# Patient Record
Sex: Female | Born: 1980 | Race: Black or African American | Hispanic: No | Marital: Single | Smoking: Current every day smoker
Health system: Southern US, Community
[De-identification: ages and names within clinical notes are randomized; demographics above are authoritative.]

## PROBLEM LIST (undated history)

## (undated) DIAGNOSIS — F319 Bipolar disorder, unspecified: Secondary | ICD-10-CM

## (undated) DIAGNOSIS — J45909 Unspecified asthma, uncomplicated: Secondary | ICD-10-CM

## (undated) DIAGNOSIS — I10 Essential (primary) hypertension: Secondary | ICD-10-CM

## (undated) DIAGNOSIS — J40 Bronchitis, not specified as acute or chronic: Secondary | ICD-10-CM

---

## 2006-10-30 ENCOUNTER — Emergency Department: Payer: Self-pay | Admitting: Emergency Medicine

## 2010-02-16 ENCOUNTER — Emergency Department: Payer: Self-pay | Admitting: Internal Medicine

## 2010-08-10 ENCOUNTER — Emergency Department: Payer: Self-pay | Admitting: Unknown Physician Specialty

## 2011-03-26 ENCOUNTER — Emergency Department: Payer: Self-pay | Admitting: Unknown Physician Specialty

## 2011-03-26 LAB — URINALYSIS, COMPLETE
Bacteria: NONE SEEN
Bilirubin,UR: NEGATIVE
Blood: NEGATIVE
Glucose,UR: NEGATIVE mg/dL (ref 0–75)
Ketone: NEGATIVE
Leukocyte Esterase: NEGATIVE
Nitrite: NEGATIVE
Ph: 6 (ref 4.5–8.0)
Protein: NEGATIVE
RBC,UR: 1 /HPF (ref 0–5)
Specific Gravity: 1.013 (ref 1.003–1.030)
Squamous Epithelial: 6
WBC UR: 4 /HPF (ref 0–5)

## 2011-03-26 LAB — PREGNANCY, URINE: Pregnancy Test, Urine: NEGATIVE m[IU]/mL

## 2011-10-26 ENCOUNTER — Emergency Department: Payer: Self-pay | Admitting: Emergency Medicine

## 2011-10-26 LAB — BASIC METABOLIC PANEL
Anion Gap: 8 (ref 7–16)
BUN: 6 mg/dL — ABNORMAL LOW (ref 7–18)
Calcium, Total: 8.9 mg/dL (ref 8.5–10.1)
Co2: 26 mmol/L (ref 21–32)
EGFR (African American): >60
Glucose: 100 mg/dL — ABNORMAL HIGH (ref 65–99)
Osmolality: 279 (ref 275–301)
Potassium: 3.8 mmol/L (ref 3.5–5.1)

## 2011-10-26 LAB — CBC
HCT: 38.9 % (ref 35.0–47.0)
HGB: 13.5 g/dL (ref 12.0–16.0)
MCH: 30.8 pg (ref 26.0–34.0)
MCHC: 34.7 g/dL (ref 32.0–36.0)
Platelet: 232 10*3/uL (ref 150–440)
RBC: 4.39 10*6/uL (ref 3.80–5.20)
WBC: 4 10*3/uL (ref 3.6–11.0)

## 2011-10-26 LAB — CK TOTAL AND CKMB (NOT AT ARMC)
CK, Total: 147 U/L (ref 21–215)
CK-MB: 0.6 ng/mL (ref 0.5–3.6)

## 2011-10-26 LAB — TROPONIN I: Troponin-I: <0.02 ng/mL

## 2012-04-20 ENCOUNTER — Emergency Department: Payer: Self-pay | Admitting: Emergency Medicine

## 2013-10-15 ENCOUNTER — Emergency Department: Payer: Self-pay | Admitting: Emergency Medicine

## 2014-03-11 ENCOUNTER — Emergency Department: Payer: Self-pay | Admitting: Emergency Medicine

## 2015-01-06 ENCOUNTER — Other Ambulatory Visit: Payer: Self-pay | Admitting: Obstetrics and Gynecology

## 2015-01-06 DIAGNOSIS — Z3491 Encounter for supervision of normal pregnancy, unspecified, first trimester: Secondary | ICD-10-CM

## 2015-01-07 ENCOUNTER — Encounter: Payer: Self-pay | Admitting: Emergency Medicine

## 2015-01-07 ENCOUNTER — Observation Stay: Payer: Medicaid Other

## 2015-01-07 ENCOUNTER — Observation Stay
Admission: EM | Admit: 2015-01-07 | Discharge: 2015-01-08 | Disposition: A | Discharge status: Other Acute Inpatient Hospital | Payer: Medicaid Other | Attending: Obstetrics and Gynecology | Admitting: Obstetrics and Gynecology

## 2015-01-07 ENCOUNTER — Emergency Department: Payer: Medicaid Other

## 2015-01-07 ENCOUNTER — Ambulatory Visit (HOSPITAL_COMMUNITY)
Admission: AD | Admit: 2015-01-07 | Discharge: 2015-01-07 | Disposition: A | Discharge status: Home / Self Care | Payer: Medicaid Other | Source: Other Acute Inpatient Hospital | Attending: Obstetrics and Gynecology | Admitting: Obstetrics and Gynecology

## 2015-01-07 DIAGNOSIS — O2620 Pregnancy care for patient with recurrent pregnancy loss, unspecified trimester: Secondary | ICD-10-CM

## 2015-01-07 DIAGNOSIS — N939 Abnormal uterine and vaginal bleeding, unspecified: Principal | ICD-10-CM | POA: Insufficient documentation

## 2015-01-07 DIAGNOSIS — Z3A20 20 weeks gestation of pregnancy: Secondary | ICD-10-CM | POA: Diagnosis present

## 2015-01-07 DIAGNOSIS — I10 Essential (primary) hypertension: Secondary | ICD-10-CM | POA: Diagnosis not present

## 2015-01-07 DIAGNOSIS — O4102X0 Oligohydramnios, second trimester, not applicable or unspecified: Secondary | ICD-10-CM

## 2015-01-07 DIAGNOSIS — Z3A00 Weeks of gestation of pregnancy not specified: Secondary | ICD-10-CM | POA: Diagnosis not present

## 2015-01-07 DIAGNOSIS — O4103X0 Oligohydramnios, third trimester, not applicable or unspecified: Secondary | ICD-10-CM | POA: Insufficient documentation

## 2015-01-07 DIAGNOSIS — F319 Bipolar disorder, unspecified: Secondary | ICD-10-CM | POA: Insufficient documentation

## 2015-01-07 DIAGNOSIS — O99332 Smoking (tobacco) complicating pregnancy, second trimester: Secondary | ICD-10-CM | POA: Diagnosis not present

## 2015-01-07 DIAGNOSIS — O99212 Obesity complicating pregnancy, second trimester: Secondary | ICD-10-CM | POA: Diagnosis not present

## 2015-01-07 DIAGNOSIS — O2690 Pregnancy related conditions, unspecified, unspecified trimester: Secondary | ICD-10-CM | POA: Diagnosis present

## 2015-01-07 DIAGNOSIS — Z6841 Body Mass Index (BMI) 40.0 and over, adult: Secondary | ICD-10-CM | POA: Diagnosis not present

## 2015-01-07 DIAGNOSIS — O4400 Placenta previa specified as without hemorrhage, unspecified trimester: Secondary | ICD-10-CM | POA: Insufficient documentation

## 2015-01-07 DIAGNOSIS — O209 Hemorrhage in early pregnancy, unspecified: Secondary | ICD-10-CM | POA: Diagnosis present

## 2015-01-07 DIAGNOSIS — O4402 Placenta previa specified as without hemorrhage, second trimester: Secondary | ICD-10-CM

## 2015-01-07 DIAGNOSIS — R52 Pain, unspecified: Secondary | ICD-10-CM

## 2015-01-07 HISTORY — DX: Bipolar disorder, unspecified: F31.9

## 2015-01-07 HISTORY — DX: Unspecified asthma, uncomplicated: J45.909

## 2015-01-07 HISTORY — DX: Essential (primary) hypertension: I10

## 2015-01-07 LAB — CBC WITH DIFFERENTIAL/PLATELET
BASOS ABS: 0.1 10*3/uL (ref 0–0.1)
BASOS PCT: 1 %
EOS ABS: 0.2 10*3/uL (ref 0–0.7)
EOS PCT: 2 %
HCT: 34.4 % — ABNORMAL LOW (ref 35.0–47.0)
Hemoglobin: 11.3 g/dL — ABNORMAL LOW (ref 12.0–16.0)
LYMPHS PCT: 16 %
Lymphs Abs: 1.7 10*3/uL (ref 1.0–3.6)
MCH: 28.9 pg (ref 26.0–34.0)
MCHC: 32.9 g/dL (ref 32.0–36.0)
MCV: 87.9 fL (ref 80.0–100.0)
MONO ABS: 0.7 10*3/uL (ref 0.2–0.9)
Monocytes Relative: 7 %
Neutro Abs: 8.3 10*3/uL — ABNORMAL HIGH (ref 1.4–6.5)
Neutrophils Relative %: 74 %
PLATELETS: 288 10*3/uL (ref 150–440)
RBC: 3.91 MIL/uL (ref 3.80–5.20)
RDW: 14.6 % — AB (ref 11.5–14.5)
WBC: 11 10*3/uL (ref 3.6–11.0)

## 2015-01-07 LAB — URINALYSIS COMPLETE WITH MICROSCOPIC (ARMC ONLY)
BACTERIA UA: NONE SEEN
Bilirubin Urine: NEGATIVE
Glucose, UA: NEGATIVE mg/dL
Ketones, ur: NEGATIVE mg/dL
NITRITE: NEGATIVE
PH: 8 (ref 5.0–8.0)
PROTEIN: 100 mg/dL — AB
Specific Gravity, Urine: 1.013 (ref 1.005–1.030)

## 2015-01-07 LAB — COMPREHENSIVE METABOLIC PANEL
ALT: 14 U/L (ref 14–54)
AST: 16 U/L (ref 15–41)
Albumin: 2.9 g/dL — ABNORMAL LOW (ref 3.5–5.0)
Alkaline Phosphatase: 39 U/L (ref 38–126)
Anion gap: 6 (ref 5–15)
BUN: 7 mg/dL (ref 6–20)
CHLORIDE: 104 mmol/L (ref 101–111)
CO2: 26 mmol/L (ref 22–32)
Calcium: 9 mg/dL (ref 8.9–10.3)
Creatinine, Ser: 0.52 mg/dL (ref 0.44–1.00)
GFR calc Af Amer: >60 mL/min (ref >60)
Glucose, Bld: 104 mg/dL — ABNORMAL HIGH (ref 65–99)
POTASSIUM: 3.9 mmol/L (ref 3.5–5.1)
SODIUM: 136 mmol/L (ref 135–145)
Total Bilirubin: 0.1 mg/dL — ABNORMAL LOW (ref 0.3–1.2)
Total Protein: 6.3 g/dL — ABNORMAL LOW (ref 6.5–8.1)

## 2015-01-07 LAB — HCG, QUANTITATIVE, PREGNANCY: HCG, BETA CHAIN, QUANT, S: 16522 m[IU]/mL — AB (ref <5)

## 2015-01-07 LAB — PREGNANCY, URINE: PREG TEST UR: POSITIVE — AB

## 2015-01-07 MED ORDER — ACETAMINOPHEN 500 MG PO TABS
1000.0000 mg | ORAL_TABLET | Freq: Once | ORAL | Status: AC
  Administered 2015-01-07: 1000 mg via ORAL
  Filled 2015-01-07: qty 2

## 2015-01-07 MED ORDER — SODIUM CHLORIDE 0.9 % IJ SOLN: 3.0000 mL | Freq: Two times a day (BID) | INTRAMUSCULAR | Status: DC

## 2015-01-07 MED ORDER — DEXTROSE IN LACTATED RINGERS 5 % IV SOLN
INTRAVENOUS | Status: DC
  Administered 2015-01-07: 21:00:00 via INTRAVENOUS

## 2015-01-07 NOTE — ED Notes (Signed)
Patient transported to Ultrasound 

## 2015-01-07 NOTE — ED Notes (Signed)
Pt actively passing blood clots vaginally. MD aware.

## 2015-01-07 NOTE — ED Provider Notes (Signed)
[facility] Emergency Department Provider Note     Time seen: ----------------------------------------- 11:06 AM on 01/07/2015 -----------------------------------------    I have reviewed the triage vital signs and the nursing notes.   HISTORY  Chief Complaint No chief complaint on file.    HPI Andrea Hardy is a 34 y.o. female who presents ER stating she is 3 months pregnant, woke up and started having abdominal cramping and vaginal bleeding. Earlier he was mild bleeding and now she is having heavier bleeding. Patient reports just starting prenatal visits last week, this first pregnancy.   No past medical history on file.  There are no active problems to display for this patient.   No past surgical history on file.  Allergies Review of patient's allergies indicates not on file.  Social History Social History  Substance Use Topics  . Smoking status: Not on file  . Smokeless tobacco: Not on file  . Alcohol Use: Not on file    Review of Systems Constitutional: Negative for fever. Eyes: Negative for visual changes. ENT: Negative for sore throat. Cardiovascular: Negative for chest pain. Respiratory: Negative for shortness of breath. Gastrointestinal: Positive for lower abdominal cramping Genitourinary: Negative for dysuria. Positive for vaginal bleeding Musculoskeletal: Negative for back pain. Skin: Negative for rash. Neurological: Negative for headaches, focal weakness or numbness.  10-point ROS otherwise negative.  ____________________________________________   PHYSICAL EXAM:  VITAL SIGNS: ED Triage Vitals  Enc Vitals Group     BP --      Pulse --      Resp --      Temp --      Temp src --      SpO2 --      Weight --      Height --      Head Cir --      Peak Flow --      Pain Score --      Pain Loc --      Pain Edu? --      Excl. in GC? --     Constitutional: Alert and oriented. Well appearing and in no  distress. Eyes: Conjunctivae are normal. PERRL. Normal extraocular movements. ENT   Head: Normocephalic and atraumatic.   Nose: No congestion/rhinnorhea.   Mouth/Throat: Mucous membranes are moist.   Neck: No stridor. Cardiovascular: Normal rate, regular rhythm. Normal and symmetric distal pulses are present in all extremities. No murmurs, rubs, or gallops. Respiratory: Normal respiratory effort without tachypnea nor retractions. Breath sounds are clear and equal bilaterally. No wheezes/rales/rhonchi. Gastrointestinal: Soft and nontender. No distention. No abdominal bruits.  Genitourinary: Patient passing clots with active vaginal bleeding Musculoskeletal: Nontender with normal range of motion in all extremities. No joint effusions.  No lower extremity tenderness nor edema. Neurologic:  Normal speech and language. No gross focal neurologic deficits are appreciated. Speech is normal. No gait instability. Skin:  Skin is warm, dry and intact. No rash noted. Psychiatric: Mood and affect are normal. Speech and behavior are normal. Patient exhibits appropriate insight and judgment. ____________________________________________  ED COURSE:  Pertinent labs & imaging results that were available during my care of the patient were reviewed by me and considered in my medical decision making (see chart for details). Patient is in no acute distress, will check labs and ultrasound. ____________________________________________    LABS (pertinent positives/negatives)  Labs Reviewed  CBC WITH DIFFERENTIAL/PLATELET - Abnormal; Notable for the following:    Hemoglobin 11.3 (*)    HCT 34.4 (*)  RDW 14.6 (*)    Neutro Abs 8.3 (*)    All other components within normal limits  COMPREHENSIVE METABOLIC PANEL - Abnormal; Notable for the following:    Glucose, Bld 104 (*)    Total Protein 6.3 (*)    Albumin 2.9 (*)    Total Bilirubin 0.1 (*)    All other components within normal limits   URINALYSIS COMPLETEWITH MICROSCOPIC ([facility] ONLY) - Abnormal; Notable for the following:    Color, Urine RED (*)    APPearance HAZY (*)    Hgb urine dipstick 3+ (*)    Protein, ur 100 (*)    Leukocytes, UA TRACE (*)    Squamous Epithelial / LPF 0-5 (*)    All other components within normal limits  PREGNANCY, URINE - Abnormal; Notable for the following:    Preg Test, Ur POSITIVE (*)    All other components within normal limits  HCG, QUANTITATIVE, PREGNANCY - Abnormal; Notable for the following:    hCG, Beta Chain, Quant, S [id number] (*)    All other components within normal limits    RADIOLOGY  Pregnancy ultrasound  IMPRESSION: Single live intrauterine gestation of 20 weeks 1 day.  Complete placenta previa is noted as well.  However, oligohydramnios is noted, and no fetal movement was observed. Critical Value/emergent results were called by telephone at the time of interpretation on 01/07/2015 at 2:08 pm to Dr. Daryel November , who verbally acknowledged these results.  This exam is performed on an emergent basis and does not comprehensively evaluate fetal size, dating, or anatomy; follow-up complete OB US should be considered if further fetal assessment is warranted. ____________________________________________  FINAL ASSESSMENT AND PLAN  Placenta previa, second trimester pregnancy with bleeding, oligohydramnios, poor fetal movement.  Plan: Patient with labs and imaging as dictated above. Patient with above ultrasound findings, will discuss with OB/GYN on-call for [facility].  Be on 20 weeks with bleeding and pain. She will be sent up to the labor and delivery unit for further evaluation. I have attempted to contact [facility] but have not heard back.  Emily Filbert, MD   Emily Filbert, MD 01/07/15 [phone]

## 2015-01-07 NOTE — ED Notes (Signed)
Pt in via EMS; states she is 3 months pregnant w/ first pregnancy, woke up this morning w/ abdominal cramping and found blood in underwear when going to bathroom.  States she passed a clot about the size of a 50 cent piece and has continued to bleed.  Pt reports just starting prenatal visits last week, denies any previous issues w/ pregnancy.  Pt vital signs WDL, no immediate distress at this time.

## 2015-01-07 NOTE — H&P (Signed)
HPI: The patient is here for vag bleeding since 10 am. Had 1 50 cent clot in ER and has had 50% of 2 peripads with reddish blood since being here in birthpalce. Initially, there was a FHR but, now RN unable to find one. Doppler used but, no FHR. [place] placed and fetal activity was absent. [place] ordered stat.  She is a 34 y.o., female, G4P0030, at [redacted]w[redacted]d based on LMP of 10/25/14, with an Estimated Date of Delivery: 08/01/15. She is a transfer from [facility]. She reports the FOB is not involved and his here with her friend Florentina Addison today. She reports she had a miscarriage in 2014, and had 2 TABs in 2015 and in January 2016, but has decided she wants to keep this baby. She reports she has a history of hypertension and was taken off her medication when she found out she was pregnant. She also has a history of bipolar disorder and was taken off Latuda until cleared by [place] and states she is struggling without the medication. I recommend that she resume Latuda as it is a Category B and to inform us if she has worsening s/s or suicidal/homicidal ideation - benefits outweighs the risk. She also reports that she had an ultrasound of her right neck for "two lumps," but states she has not had a f/u due to pregnancy. She denies complications during this pregnancy thus far.   OB History  Gravida Para Term Preterm AB SAB TAB Ectopic Multiple Living  # Outcome Date GA Lbr Len/2nd Weight Sex Delivery Anes PTL Lv  4 Current  3 TAB 03/05/14  2 TAB 04/12/13  1 SAB 09/05/12    The following portions of the patient's history were reviewed and updated as appropriate: Past Medical History: has a past medical history of Bipolar disease, chronic and Hypertension. Past Surgical History: has no past surgical history on file. Family History: family history includes Diabetes mellitus in her mother; Hypertension in her mother. She was adopted. Social History: reports that she has been smoking. She has been smoking  about 0.50 packs per day. She does not have any smokeless tobacco history on file. She reports that she does not drink alcohol or use illicit drugs. Current Meds: has a current medication list which includes the following prescription(s): lurasidone.  Allergies: has No Known Allergies.  Patient Active Problem List  Diagnosis  . Supervision of high risk pregnancy in first trimester  . Chronic hypertension affecting pregnancy  . Morbid obesity with BMI of 45.0-49.9, adult  . Bipolar disorder, current episode mixed, moderate  . Bacterial vaginosis   GENETIC HX: NO Down syndrome, No Trisomies or spinal bifida on either side of family. No twin or triplet history.  ROS: General: No weight loss or weight gain, fatigue or fevers. HEENT: No change in vision, double vision or loss of hearing. No nosebleeds, difficulty swallowing, sore throat or any other complaints.  BREAST: + breast tenderness, no nipple discharge, no masses, no skin changes, no nodes HEART: No CP, palpatations, swelling in the feet or legs or difficulty breathing while lying flat. LUNGS:+ SOB on exertion, no Cough, Congestion or wheezing. GASTROINTESTINAL: No nausea, vomiting, diarrhea, constipation, heartburn or stomach pain GENITOURINARY: No urgency, + frequency, no dysuria. MUSCULOSKELETAL: No joint pain, weakness, cramping or back pain. NEUROLOGIC: No frequent HA's, + bilateral hand numbness, no blackouts or dizziness. PSYCHIATRIC: No anxiety, panic or depression or increased stress or suicidal ideation. +bipolar ENDOCRINE: No heat or  cold intolerance, excessive thirst or hunger. HEMATOLOGIC/LYMPH: No easy bruising, + right neck swollen lymph nodes. GYN: + vaginal bleeding, cramping, +abnormal vaginal discharge, no  vaginal odor.  EXAM: VITALS:  Vitals:  01/07/15   BP: 125/59 Pulse: 87 Reso 20  Body mass index is 49.75 kg/(m^2). GENERAL: 34 y.o.yo female in NAD. A & O x 3. HEENT: Normocephalic. No thryomegaly, no  nodes. Eyes non-icteric. Teeth: dentition is good, no missing teeth or periodontal disease noted.   HEART:S1S2, No M/R/G. LUNGS:CTA bilat. No W/R/R. ABD: No organomegaly, No umbilical hernia. Fundal height is at 19 cms. No FHT found at 2000 by US. Ext Genitalia: Clear, no BUS. Right groin - 2 large/flesh colored skin tags - pt. Reports they have been there for a long time and have not changed / non-tender Labia: no condyloma present nor erythema. Vagina: +rugae, Bright red bloody seen in vault Cervix: Blood at the os, gentle spec exam with cx seen visibly at 1 cm. Uterus: 19 cms  FHT heard at @ 160 bpm Extremities: no edema, varicose veins or rashes present. Skin: Warm and dry to touch. No rashes or lesions present.  Assessment/Plan:  A: IUP at 20 weeks 2. Complete placenta previa 3. Extreme Obesity 4. Late to Prenatal care 2 days ago 5. Bi-polar disorder (taken off Latuda) 2 days ago 6.CHTN (taken off meds during the pregnancy) 7. Fetal heart rate: not detected this pm but, present earlier today 8. Oligohydramnios 9. Flat affect  P: [facility] to confirm cardiac absence of fetus prior to transport Gentle spec exam done and cx is visible 1 cm with bloody at the os 3. Pt at risk for Vag bleeding. 4. Pt and her friend were advised of the fetus cardiac activity being absent and advised of the need [facility] vs. IOL. Dr Arlie Sol[facility]onsPost [facility] and [facility]. IV started in case of bleeding. US pending.

## 2015-01-07 NOTE — OB Triage Note (Signed)
Patient sent from ER for vaginal bleeding, patient states she started bleeding heavly around 1000 bright red blood

## 2015-01-07 NOTE — Progress Notes (Signed)
Bleeding unchanged. Wating on US to verify fetal demise. [facility] for poss IOL vs D&E. [facility] has been contacted. As soon as US is complete, will access transport. Dr Aurelio BrashBeaseley is aware and agrees with plan of care. Pt informed of baby and the problem encountered today on US: Oligo and no fetal movement. Now, no FHR detected or found. Pt aware of the risks of heavy vag bleeding and poss need for transfusions due to potential blood loss form the complete previa. Pt agrees to transport.

## 2015-01-07 NOTE — Progress Notes (Signed)
US done and confirmed fetal demise at 20 weeks, no cardiac activity. Dr Jose PersiaAnna Lisa Post accepts [facility] and [facility] has unit enroute to retrieive pt.

## 2015-01-08 DIAGNOSIS — N939 Abnormal uterine and vaginal bleeding, unspecified: Secondary | ICD-10-CM | POA: Diagnosis not present

## 2015-01-20 ENCOUNTER — Ambulatory Visit: Payer: Self-pay

## 2015-02-03 ENCOUNTER — Ambulatory Visit: Payer: Medicaid Other

## 2015-02-28 ENCOUNTER — Ambulatory Visit
Admission: RE | Admit: 2015-02-28 | Discharge: 2015-02-28 | Disposition: A | Discharge status: Home / Self Care | Payer: Medicaid Other | Source: Ambulatory Visit | Attending: Obstetrics & Gynecology | Admitting: Obstetrics & Gynecology

## 2015-02-28 ENCOUNTER — Institutional Professional Consult (permissible substitution): Payer: Medicaid Other

## 2015-02-28 VITALS — BP 146/90 | HR 88 | Temp 98.2°F | Wt 262.0 lb

## 2015-02-28 DIAGNOSIS — Z8279 Family history of other congenital malformations, deformations and chromosomal abnormalities: Secondary | ICD-10-CM

## 2015-02-28 NOTE — Progress Notes (Addendum)
Referring physician:  [facility] Length of Consultation:  50 minutes  Andrea Hardy was seen for genetic counseling to review the results of genetic testing on her recent pregnancy loss.  This letter is a summary of our conversation and will be appended upon results of testing on the patient and the results of the autopsy on the fetus.  Review of records and discussion with the patient about her recent pregnancy loss revealed that she had some bleeding and her water broke on 01/08/2015 at [redacted] weeks gestation.  An ultrasound performed at the [facility] ER revealed no heart beat at that time and no anhydramnios. Visualization of fetal anatomy was limited by the lack of fluid.  The patient was sent to 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 Genetic testing through chromosome analysis and chromosomal microarray was performed on POC and an autopsy was also ordered.  At the time of this visit, the autopsy was still pending but is expected to be resulted out soon.  Chromosome analysis revealed normal karyotype, 46,XY.  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  This deletion is known to have highly variable findings, from a completely asymptomatic phenotype to findings which may include microcephaly, mild intellectual disability, dysmorphic facial features, eye anomalies, behavioral and psychiatric abnormalities.  Some other birth defects including heart defects, genitourinary anomalies, brain malformation and seizures have also been reported.  Approximately 50% of cases of 1q21.1 deletion syndrome are inherited from a parent, who may have a normal or mild phenotype.  Multiple  genes are known to be present in this region, which is approximately 2 Mb of DNA.  The 82M41.5-83E94.0 duplication also results in a condition that may have variable phenotype including hypotonia, developmental delay or intellectual disabilities, seizures, dysmorphic features and autism spectrum disorders.  It is estimated that 85% of cases of this duplication are de novo, but when the condition is maternal in origin, the phenotype may be more severe.  Greater than 130 genes are known to be present in this 5.2 Mb region.  Because both of these findings may be either de novo or inherited from a parent, we offered the option of parental FISH analysis of these regions.  The father of the pregnancy is not available for testing at this time.  The patient elected to proceed with testing today and we will follow up with her once those results become available.  Maternal blood was drawn in a green top tube and sent directly to the [facility] [facility] [place] from [facility] via courier.  Orders were placed in the [facility] system by Dr. Claybon Jabs.  The testing for the 76K duplication will be performed by the lab at no charge to further assess this family, and the 1q deletion studies will be performed at a normal fee.  Results of the autopsy may also provide additional information about this pregnancy loss.  It is not clear at this time if these two chromosomal findings would be significant enough to result in the anomalies seen and the loss of the pregnancy, as these two findings are not typically expected to be lethal.  However, we also cannot exclude these findings as the cause for the differences in this fetus.  Once the maternal test  results are available, we will better be able to provide a recurrence risk assessment for these chromosome differences in any future pregnancy.  Because the father is not available for testing, however, we cannot comment on the chance that the differences may have been paternally inherited.  If  the patient were to pursue another pregnancy with this partner, we would recommend testing him to rule out the deletion/duplication in him.  It is also possible that the autopsy results can provide additional information which may suggest other possible causes including genetic syndromes which would not be detected by the previous tests.  We inquired about the family history and pregnancy history for Andrea Hardy.  She stated that this was her fifth pregnancy.  She had three elective terminations and one early miscarriage with other partners prior to this pregnancy. The father of this pregnancy has two healthy children, ages 2 years and 3 months.  Andrea Hardy said that she had no medical information about her family or the family of the father of the baby.  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  Of note, she also states that her mother drank alcohol during her pregnancy with her and she has been told that may be the reason for her behavioral and learning concerns. We talked about various causes for these issues, including prenatal exposures, genetic factors and environmental factors.  If her genetic testing reveals any chromosome differences, that may also be a contributing factor.  Andrea Hardy also expressed concern about having smoked cigarettes and marijuana during her recent pregnancy.  While smoking has been associated with an increased chance for gastroschisis in some studies, we would not expect those exposures to be related to the microdeletion/duplication findings.  Thank you for allowing [place] to be involved in the care of this patient.  We may be reached at [phone] for any questions or concerns.  Wilburt Finlay, MS, CGC  i was available for this consultation  [place], [place] A, MD   Addendum 05/02/2015:  Results of the [facility] analysis are now available and have been communicated  to the patient.  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  As stated above, the phenotype associated with this deletion is highly variable and may be modified by other factors such as epigenetic factors or genetic background.  Because Andrea Hardy is known to have this deletion and has had one child with birth defects, we would recommend additional genetic counseling and the option of prenatal diagnosis in any future pregnancy to assess for this deletion.  It would be difficult, however, to predict the severity of the phenotype prenatally given how variable this can be.

## 2015-02-28 NOTE — ED Notes (Signed)
Venous blood specimen collected for testing that's to be sent and performed at [facility].

## 2015-05-02 NOTE — Addendum Note (Signed)
Encounter addended by: Katrina Stackeborah Adarian Bur on: 05/02/2015  2:48 PM<BR>     Documentation filed: Notes Section

## 2015-06-02 ENCOUNTER — Emergency Department: Payer: Medicaid Other

## 2015-06-02 ENCOUNTER — Emergency Department
Admission: EM | Admit: 2015-06-02 | Discharge: 2015-06-02 | Disposition: A | Discharge status: Home / Self Care | Payer: Medicaid Other | Attending: Emergency Medicine | Admitting: Emergency Medicine

## 2015-06-02 DIAGNOSIS — F17210 Nicotine dependence, cigarettes, uncomplicated: Secondary | ICD-10-CM | POA: Diagnosis not present

## 2015-06-02 DIAGNOSIS — M79662 Pain in left lower leg: Secondary | ICD-10-CM | POA: Insufficient documentation

## 2015-06-02 DIAGNOSIS — J45909 Unspecified asthma, uncomplicated: Secondary | ICD-10-CM | POA: Insufficient documentation

## 2015-06-02 DIAGNOSIS — I10 Essential (primary) hypertension: Secondary | ICD-10-CM | POA: Insufficient documentation

## 2015-06-02 DIAGNOSIS — F319 Bipolar disorder, unspecified: Secondary | ICD-10-CM | POA: Diagnosis not present

## 2015-06-02 DIAGNOSIS — M79605 Pain in left leg: Secondary | ICD-10-CM

## 2015-06-02 MED ORDER — HYDROCODONE-ACETAMINOPHEN 5-325 MG PO TABS
ORAL_TABLET | ORAL | Status: AC
  Filled 2015-06-02: qty 2

## 2015-06-02 MED ORDER — IBUPROFEN 800 MG PO TABS: 800.0000 mg | ORAL_TABLET | Freq: Three times a day (TID) | ORAL | Status: DC | PRN

## 2015-06-02 MED ORDER — CYCLOBENZAPRINE HCL 10 MG PO TABS: 10.0000 mg | ORAL_TABLET | Freq: Three times a day (TID) | ORAL | Status: DC | PRN

## 2015-06-02 MED ORDER — HYDROCODONE-ACETAMINOPHEN 5-325 MG PO TABS
2.0000 | ORAL_TABLET | Freq: Once | ORAL | Status: AC
  Administered 2015-06-02: 2 via ORAL

## 2015-06-02 NOTE — Discharge Instructions (Signed)
Heat Therapy °Heat therapy can help ease sore, stiff, injured, and tight muscles and joints. Heat relaxes your muscles, which may help ease your pain.  °RISKS AND COMPLICATIONS °If you have any of the following conditions, do not use heat therapy unless your health care provider has approved: °· Poor circulation. °· Healing wounds or scarred skin in the area being treated. °· Diabetes, heart disease, or high blood pressure. °· Not being able to feel (numbness) the area being treated. °· Unusual swelling of the area being treated. °· Active infections. °· Blood clots. °· Cancer. °· Inability to communicate pain. This may include young children and people who have problems with their brain function (dementia). °· Pregnancy. °Heat therapy should only be used on old, pre-existing, or long-lasting (chronic) injuries. Do not use heat therapy on new injuries unless directed by your health care provider. °HOW TO USE HEAT THERAPY °There are several different kinds of heat therapy, including: °· Moist heat pack. °· Warm water bath. °· Hot water bottle. °· Electric heating pad. °· Heated gel pack. °· Heated wrap. °· Electric heating pad. °Use the heat therapy method suggested by your health care provider. Follow your health care provider's instructions on when and how to use heat therapy. °GENERAL HEAT THERAPY RECOMMENDATIONS °· Do not sleep while using heat therapy. Only use heat therapy while you are awake. °· Your skin may turn pink while using heat therapy. Do not use heat therapy if your skin turns red. °· Do not use heat therapy if you have new pain. °· High heat or long exposure to heat can cause burns. Be careful when using heat therapy to avoid burning your skin. °· Do not use heat therapy on areas of your skin that are already irritated, such as with a rash or sunburn. °SEEK MEDICAL CARE IF: °· You have blisters, redness, swelling, or numbness. °· You have new pain. °· Your pain is worse. °MAKE SURE  YOU: °· Understand these instructions. °· Will watch your condition. °· Will get help right away if you are not doing well or get worse. °  °This information is not intended to replace advice given to you by your health care provider. Make sure you discuss any questions you have with your health care provider. °  °Document Released: 04/16/2011 Document Revised: 02/12/2014 Document Reviewed: 03/17/2013 °Elsevier Interactive Patient Education ©2016 [employer] ° °Musculoskeletal Pain °Musculoskeletal pain is muscle and boney aches and pains. These pains can occur in any part of the body. Your caregiver may treat you without knowing the cause of the pain. They may treat you if blood or urine tests, X-rays, and other tests were normal.  °CAUSES °There is often not a definite cause or reason for these pains. These pains may be caused by a type of germ (virus). The discomfort may also come from overuse. Overuse includes working out too hard when your body is not fit. Boney aches also come from weather changes. Bone is sensitive to atmospheric pressure changes. °HOME CARE INSTRUCTIONS  °· Ask when your test results will be ready. Make sure you get your test results. °· Only take over-the-counter or prescription medicines for pain, discomfort, or fever as directed by your caregiver. If you were given medications for your condition, do not drive, operate machinery or power tools, or sign legal documents for 24 hours. Do not drink alcohol. Do not take sleeping pills or other medications that may interfere with treatment. °· Continue all activities unless the activities cause   more pain. When the pain lessens, slowly resume normal activities. Gradually increase the intensity and duration of the activities or exercise. °· During periods of severe pain, bed rest may be helpful. Lay or sit in any position that is comfortable. °· Putting ice on the injured area. °¨ Put ice in a bag. °¨ Place a towel between your skin and the  bag. °¨ Leave the ice on for 15 to 20 minutes, 3 to 4 times a day. °· Follow up with your caregiver for continued problems and no reason can be found for the pain. If the pain becomes worse or does not go away, it may be necessary to repeat tests or do additional testing. Your caregiver may need to look further for a possible cause. °SEEK IMMEDIATE MEDICAL CARE IF: °· You have pain that is getting worse and is not relieved by medications. °· You develop chest pain that is associated with shortness or breath, sweating, feeling sick to your stomach (nauseous), or throw up (vomit). °· Your pain becomes localized to the abdomen. °· You develop any new symptoms that seem different or that concern you. °MAKE SURE YOU:  °· Understand these instructions. °· Will watch your condition. °· Will get help right away if you are not doing well or get worse. °  °This information is not intended to replace advice given to you by your health care provider. Make sure you discuss any questions you have with your health care provider. °  °Document Released: 01/22/2005 Document Revised: 04/16/2011 Document Reviewed: 09/26/2012 °Elsevier Interactive Patient Education ©2016 [employer]vier Inc[employer] ° °

## 2015-06-02 NOTE — ED Provider Notes (Signed)
[facility] Emergency Department Provider Note  ____________________________________________  Time seen: Approximately 11:17 AM  I have reviewed the triage vital signs and the nursing notes.   HISTORY  Chief Complaint Leg Pain    HPI Andrea Hardy is a 35 y.o. female who presents with a gradual increase and posterior left leg pain. Patient states that pain is increasing when she is walking and relieved with rest. Patient is a active smoker but not on birth control. She is not sure if she's injured her leg. Describes her pain is nonradiating 8/10.   Past Medical History  Diagnosis Date  . Asthma   . Bipolar 1 disorder (HCC)   . Hypertension     Patient Active Problem List   Diagnosis Date Noted  . Family history of chromosomal microdeletion 02/28/2015  . Indication for care in labor and delivery, antepartum 01/07/2015  . Vaginal bleeding before [redacted] weeks gestation 01/07/2015    History reviewed. No pertinent past surgical history.  Current Outpatient Rx  Name  Route  Sig  Dispense  Refill  . cyclobenzaprine (FLEXERIL) 10 MG tablet   Oral   Take 1 tablet (10 mg total) by mouth every 8 (eight) hours as needed for muscle spasms.   30 tablet   1   . ibuprofen (ADVIL,MOTRIN) 800 MG tablet   Oral   Take 1 tablet (800 mg total) by mouth every 8 (eight) hours as needed.   30 tablet   0   . lurasidone (LATUDA) 80 MG TABS tablet   Oral   Take 80 mg by mouth at bedtime.           Allergies Review of patient's allergies indicates no known allergies.  No family history on file.  Social History Social History  Substance Use Topics  . Smoking status: Current Every Day Smoker -- 1.00 packs/day    Types: Cigarettes  . Smokeless tobacco: Never Used  . Alcohol Use: No    Review of Systems Constitutional: No fever/chills Cardiovascular: Denies chest pain. Musculoskeletal: Positive for posterior lower extremity left leg pain Skin: Negative  for rash. Neurological: Negative for headaches, focal weakness or numbness.  10-point ROS otherwise negative.  ____________________________________________   PHYSICAL EXAM: BP 138/82 mmHg  Pulse 66  Temp(Src) 98.2 F (36.8 C) (Oral)  Resp 16  Ht  (1.575 m)  Wt 118.389 kg  BMI 47.73 kg/m2  SpO2 96%  LMP 05/07/2015 (Approximate)  Breastfeeding? Unknown  VITAL SIGNS: ED Triage Vitals  Enc Vitals Group     BP --      Pulse --      Resp --      Temp --      Temp src --      SpO2 --      Weight --      Height --      Head Cir --      Peak Flow --      Pain Score --      Pain Loc --      Pain Edu? --      Excl. in GC? --     Constitutional: Alert and oriented. Well appearing and in no acute distress. Eyes: Conjunctivae are normal. PERRL. EOMI. Head: Atraumatic. Nose: No congestion/rhinnorhea. Mouth/Throat: Mucous membranes are moist.  Oropharynx non-erythematous. Neck: No stridor.   Cardiovascular: Normal rate, regular rhythm. Grossly normal heart sounds.  Good peripheral circulation. Respiratory: Normal respiratory effort.  No retractions. Lungs CTAB. Gastrointestinal: Soft  and nontender. No distention. No abdominal bruits. No CVA tenderness. Musculoskeletal: Point tenderness to the posterior aspect left calf. No ecchymosis or bruising noted. Negative Homans. Neurologic:  Normal speech and language. No gross focal neurologic deficits are appreciated. No gait instability. Skin:  Skin is warm, dry and intact. No rash noted. Psychiatric: Mood and affect are normal. Speech and behavior are normal.  ____________________________________________   LABS (all labs ordered are listed, but only abnormal results are displayed)  Labs Reviewed - No data to display  RADIOLOGY  Ultrasound left lower extremity unremarkable for DVT or thrombosis. ____________________________________________   PROCEDURES  Procedure(s) performed: None  Critical Care performed:  No  ____________________________________________   INITIAL IMPRESSION / ASSESSMENT AND PLAN / ED COURSE  Pertinent labs & imaging results that were available during my care of the patient were reviewed by me and considered in my medical decision making (see chart for details).  Left leg musculoskeletal strain. Rx given for Flexeril 10 mg 3 times a day, Motrin milligrams Teddy. Patient encouraged follow-up with PCP or return to the ER for any worsening symptomology. ____________________________________________   FINAL CLINICAL IMPRESSION(S) / ED DIAGNOSES  Final diagnoses:  Pain of left lower extremity     This chart was dictated using voice recognition software/Dragon. Despite best efforts to proofread, errors can occur which can change the meaning. Any change was purely unintentional.   Evangeline Dakinharles M Xeng Kucher, PA-C 06/02/15 1312  Sharyn CreamerMark Quale, MD 06/02/15 [phone]

## 2015-06-02 NOTE — ED Notes (Signed)
Pain to left leg behind knee. Began last Wednesday. States leg is swollen and painful.

## 2015-07-19 ENCOUNTER — Encounter: Payer: Self-pay | Admitting: Emergency Medicine

## 2015-07-19 ENCOUNTER — Emergency Department
Admission: EM | Admit: 2015-07-19 | Discharge: 2015-07-19 | Disposition: A | Discharge status: Home / Self Care | Payer: Medicaid Other | Attending: Emergency Medicine | Admitting: Emergency Medicine

## 2015-07-19 DIAGNOSIS — R3911 Hesitancy of micturition: Secondary | ICD-10-CM | POA: Insufficient documentation

## 2015-07-19 DIAGNOSIS — F17210 Nicotine dependence, cigarettes, uncomplicated: Secondary | ICD-10-CM | POA: Diagnosis not present

## 2015-07-19 DIAGNOSIS — F319 Bipolar disorder, unspecified: Secondary | ICD-10-CM | POA: Diagnosis not present

## 2015-07-19 DIAGNOSIS — I10 Essential (primary) hypertension: Secondary | ICD-10-CM | POA: Insufficient documentation

## 2015-07-19 DIAGNOSIS — F1290 Cannabis use, unspecified, uncomplicated: Secondary | ICD-10-CM | POA: Diagnosis not present

## 2015-07-19 DIAGNOSIS — J45909 Unspecified asthma, uncomplicated: Secondary | ICD-10-CM | POA: Diagnosis not present

## 2015-07-19 DIAGNOSIS — R339 Retention of urine, unspecified: Secondary | ICD-10-CM | POA: Diagnosis present

## 2015-07-19 LAB — URINALYSIS COMPLETE WITH MICROSCOPIC (ARMC ONLY)
Bilirubin Urine: NEGATIVE
Glucose, UA: NEGATIVE mg/dL
Hgb urine dipstick: NEGATIVE
Ketones, ur: NEGATIVE mg/dL
Leukocytes, UA: NEGATIVE
Nitrite: NEGATIVE
PROTEIN: NEGATIVE mg/dL
Specific Gravity, Urine: 1.025 (ref 1.005–1.030)
pH: 6 (ref 5.0–8.0)

## 2015-07-19 LAB — CBC
HCT: 37.3 % (ref 35.0–47.0)
HEMOGLOBIN: 12.5 g/dL (ref 12.0–16.0)
MCH: 27 pg (ref 26.0–34.0)
MCHC: 33.4 g/dL (ref 32.0–36.0)
MCV: 80.7 fL (ref 80.0–100.0)
Platelets: 275 10*3/uL (ref 150–440)
RBC: 4.63 MIL/uL (ref 3.80–5.20)
RDW: 18.9 % — ABNORMAL HIGH (ref 11.5–14.5)
WBC: 6.9 10*3/uL (ref 3.6–11.0)

## 2015-07-19 LAB — BASIC METABOLIC PANEL
Anion gap: 9 (ref 5–15)
BUN: 10 mg/dL (ref 6–20)
CHLORIDE: 106 mmol/L (ref 101–111)
CO2: 24 mmol/L (ref 22–32)
CREATININE: 0.91 mg/dL (ref 0.44–1.00)
Calcium: 9.4 mg/dL (ref 8.9–10.3)
GFR calc Af Amer: >60 mL/min (ref >60)
GFR calc non Af Amer: >60 mL/min (ref >60)
Glucose, Bld: 96 mg/dL (ref 65–99)
Potassium: 3.2 mmol/L — ABNORMAL LOW (ref 3.5–5.1)
SODIUM: 139 mmol/L (ref 135–145)

## 2015-07-19 LAB — POCT PREGNANCY, URINE: PREG TEST UR: NEGATIVE

## 2015-07-19 MED ORDER — ALBUTEROL SULFATE HFA 108 (90 BASE) MCG/ACT IN AERS: 2.0000 | INHALATION_SPRAY | RESPIRATORY_TRACT | Status: DC | PRN

## 2015-07-19 NOTE — ED Provider Notes (Signed)
[facility] Emergency Department Provider Note  ____________________________________________  Time seen: 10:00 PM  I have reviewed the triage vital signs and the nursing notes.   HISTORY  Chief Complaint Urinary Retention    HPI Andrea Hardy is a 35 y.o. female who complains of difficulty urinating and only urinating small amounts over the past 2 weeks. She has been taking albuterol and cough medicines for bronchitis recently. Denies any other medications or or medical problems. No drug use.No dysuria frequency urgency or hematuria. No back pain  She has been feeling thirsty and drinking lots of water. Has been noticing dry mouth as well.   Past Medical History  Diagnosis Date  . Asthma   . Bipolar 1 disorder (HCC)   . Hypertension      Patient Active Problem List   Diagnosis Date Noted  . Family history of chromosomal microdeletion 02/28/2015  . Indication for care in labor and delivery, antepartum 01/07/2015  . Vaginal bleeding before [redacted] weeks gestation 01/07/2015     History reviewed. No pertinent past surgical history.   Current Outpatient Rx  Name  Route  Sig  Dispense  Refill  . cyclobenzaprine (FLEXERIL) 10 MG tablet   Oral   Take 1 tablet (10 mg total) by mouth every 8 (eight) hours as needed for muscle spasms.   30 tablet   1   . ibuprofen (ADVIL,MOTRIN) 800 MG tablet   Oral   Take 1 tablet (800 mg total) by mouth every 8 (eight) hours as needed.   30 tablet   0   . lurasidone (LATUDA) 80 MG TABS tablet   Oral   Take 80 mg by mouth at bedtime.            Allergies Review of patient's allergies indicates no known allergies.   No family history on file.  Social History Social History  Substance Use Topics  . Smoking status: Current Every Day Smoker -- 1.00 packs/day    Types: Cigarettes  . Smokeless tobacco: Never Used  . Alcohol Use: No    Review of Systems  Constitutional:   No fever or chills.  Eyes:    No vision changes.  ENT:   No sore throat. No rhinorrhea. Cardiovascular:   No chest pain. Respiratory:   No dyspnea or cough. Gastrointestinal:   Negative for abdominal pain, vomiting and diarrhea.  Genitourinary:   Difficulty urinating. Musculoskeletal:   Negative for focal pain or swelling Neurological:   Negative for headaches 10-point ROS otherwise negative.  ____________________________________________   PHYSICAL EXAM:  VITAL SIGNS: ED Triage Vitals  Enc Vitals Group     BP 07/19/15 2042 155/91 mmHg     Pulse Rate 07/19/15 2042 81     Resp 07/19/15 2042 20     Temp 07/19/15 2042 98.4 F (36.9 C)     Temp Source 07/19/15 2042 Oral     SpO2 07/19/15 2042 95 %     Weight 07/19/15 2042 220 lb (99.791 kg)     Height 07/19/15 2042 5\' 2"  (1.575 m)     Head Cir --      Peak Flow --      Pain Score --      Pain Loc --      Pain Edu? --      Excl. in GC? --     Vital signs reviewed, nursing assessments reviewed.   Constitutional:   Alert and oriented. Well appearing and in no distress. Eyes:  No scleral icterus. No conjunctival pallor. PERRL. EOMI.  No nystagmus. ENT   Head:   Normocephalic and atraumatic.   Nose:   No congestion/rhinnorhea. No septal hematoma   Mouth/Throat:   MMM, no pharyngeal erythema. No peritonsillar mass.    Neck:   No stridor. No SubQ emphysema. No meningismus. Hematological/Lymphatic/Immunilogical:   No cervical lymphadenopathy. Cardiovascular:   RRR. Symmetric bilateral radial and DP pulses.  No murmurs.  Respiratory:   Normal respiratory effort without tachypnea nor retractions. Breath sounds are clear and equal bilaterally. No wheezes/rales/rhonchi. Gastrointestinal:   Soft and nontender. Non distended. There is no CVA tenderness.  No rebound, rigidity, or guarding. Genitourinary:   deferred Musculoskeletal:   Nontender with normal range of motion in all extremities. No joint effusions.  No lower extremity tenderness.  No  edema. Neurologic:   Normal speech and language.  CN 2-10 normal. Motor grossly intact. No gross focal neurologic deficits are appreciated.  Skin:    Skin is warm, dry and intact. No rash noted.  No petechiae, purpura, or bullae.  ____________________________________________    LABS (pertinent positives/negatives) (all labs ordered are listed, but only abnormal results are displayed) Labs Reviewed  CBC - Abnormal; Notable for the following:    RDW 18.9 (*)    All other components within normal limits  BASIC METABOLIC PANEL - Abnormal; Notable for the following:    Potassium 3.2 (*)    All other components within normal limits  URINALYSIS COMPLETEWITH MICROSCOPIC ([facility] ONLY) - Abnormal; Notable for the following:    Color, Urine YELLOW (*)    APPearance CLEAR (*)    Bacteria, UA RARE (*)    Squamous Epithelial / LPF 6-30 (*)    All other components within normal limits  POCT PREGNANCY, URINE   ____________________________________________   EKG    ____________________________________________    RADIOLOGY    ____________________________________________   PROCEDURES   ____________________________________________   INITIAL IMPRESSION / ASSESSMENT AND PLAN / ED COURSE  Pertinent labs & imaging results that were available during my care of the patient were reviewed by me and considered in my medical decision making (see chart for details).  Patient spontaneously voided in the ED. Labs all negative. No evidence of urinary tract infection at all. She very well appearing. I suspect either mild dehydration or more likely a mild anticholinergic syndrome with her medications that she's been taking for bronchitis. She denies taking Latuda or Flexeril which may also worsen this. Counseled her against any medication use right now. Continue oral fluids and follow up with primary care.     ____________________________________________   FINAL CLINICAL IMPRESSION(S) / ED  DIAGNOSES  Final diagnoses:  Urinary hesitancy       Portions of this note were generated with dragon dictation software. Dictation errors may occur despite best attempts at proofreading.   Sharman Cheek, MD 07/19/15 2257

## 2015-07-19 NOTE — ED Notes (Signed)
Patient ambulatory to triage with steady gait, without difficulty or distress noted; pt reports has not urinated in 2wks; denies hx of same and denies any pain or accomp symptoms; pt able to void, qs in triage

## 2015-07-19 NOTE — ED Notes (Signed)
Pt bladder scan 300 ML

## 2015-07-19 NOTE — ED Notes (Signed)
Pt reports she had been unable to urinate, states she only urinates small amounts

## 2016-01-28 ENCOUNTER — Encounter (HOSPITAL_COMMUNITY): Payer: Self-pay

## 2016-01-28 ENCOUNTER — Emergency Department (HOSPITAL_COMMUNITY)
Admission: EM | Admit: 2016-01-28 | Discharge: 2016-01-28 | Disposition: A | Discharge status: Home / Self Care | Payer: Medicaid Other | Attending: Emergency Medicine | Admitting: Emergency Medicine

## 2016-01-28 DIAGNOSIS — J4541 Moderate persistent asthma with (acute) exacerbation: Secondary | ICD-10-CM | POA: Diagnosis not present

## 2016-01-28 DIAGNOSIS — I10 Essential (primary) hypertension: Secondary | ICD-10-CM | POA: Diagnosis not present

## 2016-01-28 DIAGNOSIS — F17210 Nicotine dependence, cigarettes, uncomplicated: Secondary | ICD-10-CM | POA: Insufficient documentation

## 2016-01-28 DIAGNOSIS — R0602 Shortness of breath: Secondary | ICD-10-CM | POA: Diagnosis present

## 2016-01-28 MED ORDER — ALBUTEROL SULFATE HFA 108 (90 BASE) MCG/ACT IN AERS
2.0000 | INHALATION_SPRAY | Freq: Once | RESPIRATORY_TRACT | Status: AC
  Administered 2016-01-28: 2 via RESPIRATORY_TRACT
  Filled 2016-01-28: qty 6.7

## 2016-01-28 MED ORDER — DEXAMETHASONE SODIUM PHOSPHATE 10 MG/ML IJ SOLN
10.0000 mg | Freq: Once | INTRAMUSCULAR | Status: AC
  Administered 2016-01-28: 10 mg via INTRAMUSCULAR
  Filled 2016-01-28: qty 1

## 2016-01-28 MED ORDER — ALBUTEROL SULFATE (2.5 MG/3ML) 0.083% IN NEBU: 5.0000 mg | INHALATION_SOLUTION | Freq: Once | RESPIRATORY_TRACT | Status: DC

## 2016-01-28 MED ORDER — ALBUTEROL SULFATE (2.5 MG/3ML) 0.083% IN NEBU: 2.5000 mg | INHALATION_SOLUTION | Freq: Four times a day (QID) | RESPIRATORY_TRACT | 12 refills | Status: DC | PRN

## 2016-01-28 NOTE — ED Triage Notes (Signed)
Pt brought in by [facility]. Pt c/o SOB. Pt has hx of asthma and stated that she believes bug spray was the cause of her exacerbation. Pt was given 1 duoneb on transport. Pt O2 99% on RA with expiratory wheezing.

## 2016-01-28 NOTE — ED Provider Notes (Signed)
[facility]-EMERGENCY DEPT Provider Note   CSN: [id number] Arrival date & time: 01/28/16  1851     History   Chief Complaint No chief complaint on file.   HPI Arnette FeltsMelanie Willcutt is a 35 y.o. female.  Patient presents with wheezing and shortness of breath. Patient has a history of asthma and this feels similar. Patient thinks may been caused by bug spray. No fevers or chills. Patient denies blood clot history or blood clot risk factors. Patient not on oxygen at home. Patient has history of high blood pressure no cardiac history.      Past Medical History:  Diagnosis Date  . Asthma   . Bipolar 1 disorder (HCC)   . Hypertension     Patient Active Problem List   Diagnosis Date Noted  . Family history of chromosomal microdeletion 02/28/2015  . Indication for care in labor and delivery, antepartum 01/07/2015  . Vaginal bleeding before [redacted] weeks gestation 01/07/2015    History reviewed. No pertinent surgical history.  OB History    Gravida Para Term Preterm AB Living   4 0     3     SAB TAB Ectopic Multiple Live Births   1 2             Home Medications    Prior to Admission medications   Medication Sig Start Date End Date Taking? Authorizing Provider  albuterol (PROVENTIL HFA) 108 (90 Base) MCG/ACT inhaler Inhale 2 puffs into the lungs every 4 (four) hours as needed for wheezing or shortness of breath. 07/19/15   Sharman CheekPhillip Stafford, MD  albuterol (PROVENTIL) (2.5 MG/3ML) 0.083% nebulizer solution Take 3 mLs (2.5 mg total) by nebulization every 6 (six) hours as needed for wheezing or shortness of breath. 01/28/16   Blane OharaJoshua Lillie Bollig, MD  cyclobenzaprine (FLEXERIL) 10 MG tablet Take 1 tablet (10 mg total) by mouth every 8 (eight) hours as needed for muscle spasms. 06/02/15   Charmayne Sheerharles M Beers, PA-C  ibuprofen (ADVIL,MOTRIN) 800 MG tablet Take 1 tablet (800 mg total) by mouth every 8 (eight) hours as needed. 06/02/15   Charmayne Sheerharles M Beers, PA-C  lurasidone (LATUDA) 80 MG TABS tablet Take 80 mg by  mouth at bedtime.    Historical Provider, MD    Family History History reviewed. No pertinent family history.  Social History Social History  Substance Use Topics  . Smoking status: Current Every Day Smoker    Packs/day: 1.00    Types: Cigarettes  . Smokeless tobacco: Never Used  . Alcohol use No     Allergies   Patient has no known allergies.   Review of Systems Review of Systems  Constitutional: Negative for chills and fever.  HENT: Negative for ear pain and sore throat.   Eyes: Negative for pain and visual disturbance.  Respiratory: Positive for cough, shortness of breath and wheezing.   Cardiovascular: Negative for chest pain and palpitations.  Gastrointestinal: Negative for abdominal pain and vomiting.  Genitourinary: Negative for dysuria and hematuria.  Musculoskeletal: Negative for arthralgias and back pain.  Skin: Negative for color change and rash.  Neurological: Negative for seizures and syncope.  All other systems reviewed and are negative.    Physical Exam Updated Vital Signs BP 146/85   Pulse 92   Temp 99.2 F (37.3 C) (Oral)   Resp 24   Ht 5\' 2"  (1.575 m)   Wt 225 lb (102.1 kg)   LMP 12/31/2015   SpO2 97%   BMI 41.15 kg/m   Physical Exam  Constitutional: She appears well-developed and well-nourished. No distress.  HENT:  Head: Normocephalic and atraumatic.  Eyes: Conjunctivae are normal.  Neck: Neck supple.  Cardiovascular: Normal rate and regular rhythm.   No murmur heard. Pulmonary/Chest: Effort normal. No respiratory distress. She has wheezes. She has no rales.  Abdominal: Soft. There is no tenderness.  Musculoskeletal: She exhibits no edema.  Neurological: She is alert.  Skin: Skin is warm and dry.  Psychiatric: She has a normal mood and affect.  Nursing note and vitals reviewed.    ED Treatments / Results  Labs (all labs ordered are listed, but only abnormal results are displayed) Labs Reviewed - No data to display  EKG   EKG Interpretation None       Radiology No results found.  Procedures Procedures (including critical care time)  Medications Ordered in ED Medications  dexamethasone (DECADRON) injection 10 mg (not administered)  albuterol (PROVENTIL HFA;VENTOLIN HFA) 108 (90 Base) MCG/ACT inhaler 2 puff (not administered)     Initial Impression / Assessment and Plan / ED Course  I have reviewed the triage vital signs and the nursing notes.  Pertinent labs & imaging results that were available during my care of the patient were reviewed by me and considered in my medical decision making (see chart for details).  Clinical Course    Patient presents with worsening shortness of breath similar to previous asthma. Patient improved significantly since albuterol on route. Albuterol given in the ER and steroids. Patient refused chest x-ray she has no symptoms currently. I turned her oxygen off in the room and she was 97 percent. Patient knows she can return for further workup if it is different or worse. Results and differential diagnosis were discussed with the patient/parent/guardian. Xrays were independently reviewed by myself.  Close follow up outpatient was discussed, comfortable with the plan.   Medications  dexamethasone (DECADRON) injection 10 mg (not administered)  albuterol (PROVENTIL HFA;VENTOLIN HFA) 108 (90 Base) MCG/ACT inhaler 2 puff (not administered)    Vitals:   01/28/16 1857 01/28/16 1858 01/28/16 1945 01/28/16 2045  BP:   155/89 146/85  Pulse:   100 92  Resp:   22 24  Temp:      TempSrc:      SpO2:  (!) 89% 94% 97%  Weight: 225 lb (102.1 kg)     Height: 5\' 2"  (1.575 m)       Final diagnoses:  Asthma in adult, moderate persistent, with acute exacerbation     Final Clinical Impressions(s) / ED Diagnoses   Final diagnoses:  Asthma in adult, moderate persistent, with acute exacerbation    New Prescriptions New Prescriptions   ALBUTEROL (PROVENTIL) (2.5 MG/3ML)  0.083% NEBULIZER SOLUTION    Take 3 mLs (2.5 mg total) by nebulization every 6 (six) hours as needed for wheezing or shortness of breath.     Blane OharaJoshua Payge Eppes, MD 01/28/16 [phone]

## 2016-01-28 NOTE — Discharge Instructions (Signed)
If you were given medicines take as directed.  If you are on coumadin or contraceptives realize their levels and effectiveness is altered by many different medicines.  If you have any reaction (rash, tongues swelling, other) to the medicines stop taking and see a physician.    If your blood pressure was elevated in the ER make sure you follow up for management with a primary doctor or return for chest pain, shortness of breath or stroke symptoms.  Please follow up as directed and return to the ER or see a physician for new or worsening symptoms.  Thank you. Vitals:   01/28/16 1857 01/28/16 1858 01/28/16 1945 01/28/16 2045  BP:   155/89 146/85  Pulse:   100 92  Resp:   22 24  Temp:      TempSrc:      SpO2:  (!) 89% 94% 97%  Weight: 225 lb (102.1 kg)     Height: 5\' 2"  (1.575 m)

## 2016-02-12 ENCOUNTER — Encounter: Payer: Self-pay | Admitting: Emergency Medicine

## 2016-02-12 ENCOUNTER — Emergency Department: Payer: Medicaid Other

## 2016-02-12 ENCOUNTER — Emergency Department
Admission: EM | Admit: 2016-02-12 | Discharge: 2016-02-13 | Disposition: A | Discharge status: Home / Self Care | Payer: Medicaid Other | Attending: Emergency Medicine | Admitting: Emergency Medicine

## 2016-02-12 DIAGNOSIS — O99331 Smoking (tobacco) complicating pregnancy, first trimester: Secondary | ICD-10-CM | POA: Diagnosis not present

## 2016-02-12 DIAGNOSIS — B9789 Other viral agents as the cause of diseases classified elsewhere: Secondary | ICD-10-CM

## 2016-02-12 DIAGNOSIS — J069 Acute upper respiratory infection, unspecified: Secondary | ICD-10-CM | POA: Diagnosis not present

## 2016-02-12 DIAGNOSIS — O99511 Diseases of the respiratory system complicating pregnancy, first trimester: Secondary | ICD-10-CM | POA: Insufficient documentation

## 2016-02-12 DIAGNOSIS — R109 Unspecified abdominal pain: Secondary | ICD-10-CM | POA: Diagnosis not present

## 2016-02-12 DIAGNOSIS — O26891 Other specified pregnancy related conditions, first trimester: Secondary | ICD-10-CM | POA: Diagnosis present

## 2016-02-12 DIAGNOSIS — F17210 Nicotine dependence, cigarettes, uncomplicated: Secondary | ICD-10-CM | POA: Diagnosis not present

## 2016-02-12 DIAGNOSIS — Z79899 Other long term (current) drug therapy: Secondary | ICD-10-CM | POA: Insufficient documentation

## 2016-02-12 DIAGNOSIS — Z3A01 Less than 8 weeks gestation of pregnancy: Secondary | ICD-10-CM | POA: Insufficient documentation

## 2016-02-12 DIAGNOSIS — Z791 Long term (current) use of non-steroidal anti-inflammatories (NSAID): Secondary | ICD-10-CM | POA: Insufficient documentation

## 2016-02-12 DIAGNOSIS — I10 Essential (primary) hypertension: Secondary | ICD-10-CM | POA: Diagnosis not present

## 2016-02-12 DIAGNOSIS — J45909 Unspecified asthma, uncomplicated: Secondary | ICD-10-CM | POA: Diagnosis not present

## 2016-02-12 DIAGNOSIS — Z3401 Encounter for supervision of normal first pregnancy, first trimester: Secondary | ICD-10-CM

## 2016-02-12 LAB — COMPREHENSIVE METABOLIC PANEL
ALT: 10 U/L — AB (ref 14–54)
AST: 18 U/L (ref 15–41)
Albumin: 3.5 g/dL (ref 3.5–5.0)
Alkaline Phosphatase: 38 U/L (ref 38–126)
Anion gap: 5 (ref 5–15)
BUN: <5 mg/dL — ABNORMAL LOW (ref 6–20)
CALCIUM: 9 mg/dL (ref 8.9–10.3)
CHLORIDE: 105 mmol/L (ref 101–111)
CO2: 24 mmol/L (ref 22–32)
CREATININE: 0.58 mg/dL (ref 0.44–1.00)
Glucose, Bld: 110 mg/dL — ABNORMAL HIGH (ref 65–99)
Potassium: 3.5 mmol/L (ref 3.5–5.1)
Sodium: 134 mmol/L — ABNORMAL LOW (ref 135–145)
TOTAL PROTEIN: 7 g/dL (ref 6.5–8.1)
Total Bilirubin: 0.6 mg/dL (ref 0.3–1.2)

## 2016-02-12 LAB — URINALYSIS, COMPLETE (UACMP) WITH MICROSCOPIC
BILIRUBIN URINE: NEGATIVE
Bacteria, UA: NONE SEEN
GLUCOSE, UA: NEGATIVE mg/dL
HGB URINE DIPSTICK: NEGATIVE
Ketones, ur: NEGATIVE mg/dL
LEUKOCYTES UA: NEGATIVE
NITRITE: NEGATIVE
PH: 6 (ref 5.0–8.0)
Protein, ur: NEGATIVE mg/dL
Specific Gravity, Urine: 1.019 (ref 1.005–1.030)

## 2016-02-12 LAB — RAPID INFLUENZA A&B ANTIGENS
Influenza A (ARMC): NEGATIVE
Influenza B (ARMC): NEGATIVE

## 2016-02-12 LAB — CBC
HCT: 37.1 % (ref 35.0–47.0)
Hemoglobin: 12.9 g/dL (ref 12.0–16.0)
MCH: 29 pg (ref 26.0–34.0)
MCHC: 34.8 g/dL (ref 32.0–36.0)
MCV: 83.3 fL (ref 80.0–100.0)
PLATELETS: 323 10*3/uL (ref 150–440)
RBC: 4.45 MIL/uL (ref 3.80–5.20)
RDW: 14.5 % (ref 11.5–14.5)
WBC: 8.2 10*3/uL (ref 3.6–11.0)

## 2016-02-12 LAB — HCG, QUANTITATIVE, PREGNANCY: hCG, Beta Chain, Quant, S: 53199 m[IU]/mL — ABNORMAL HIGH (ref <5)

## 2016-02-12 LAB — POCT PREGNANCY, URINE: Preg Test, Ur: POSITIVE — AB

## 2016-02-12 NOTE — ED Notes (Signed)
Pt reports she has been having lower abd pain that she describes as cramping that started earlier today. Pt states she had a positive pregnancy test at home last night. Pt also reports she miscarried at 5 months gestation in 2016. Pt reports loose stool today, denies n/v, denies vaginal bleeding. Pt also reports sinus congestion for about 2 weeks.

## 2016-02-12 NOTE — ED Triage Notes (Signed)
Patient with complaint of feeling congested and generalized body aches that started last night. Patient states that she also thinks that she maybe pregnant. Patient states that her last period was in November. Patient states that she started having lower abdominal cramping last night, denies vaginal bleeding. Patient states that she has had a previous miscarriage.

## 2016-02-12 NOTE — ED Provider Notes (Signed)
[facility] Emergency Department Provider Note   ____________________________________________    I have reviewed the triage vital signs and the nursing notes.   HISTORY  Chief Complaint Abdominal Cramping; Nasal Congestion; and Generalized Body Aches     HPI Andrea Hardy is a 36 y.o. female who presents with complaints of nasal congestion and dry cough. Patient reports she was recently treated for upper respiratory infection and she had been doing better but now she complains of a runny nose and sinus pressure and cough. She also reports intermittent abdominal cramping bilaterally and thinks she may be pregnant. She denies vaginal bleeding.   Past Medical History:  Diagnosis Date  . Asthma   . Bipolar 1 disorder (HCC)   . Hypertension     Patient Active Problem List   Diagnosis Date Noted  . Family history of chromosomal microdeletion 02/28/2015  . Indication for care in labor and delivery, antepartum 01/07/2015  . Vaginal bleeding before [redacted] weeks gestation 01/07/2015    No past surgical history on file.  Prior to Admission medications   Medication Sig Start Date End Date Taking? Authorizing Provider  albuterol (PROVENTIL HFA) 108 (90 Base) MCG/ACT inhaler Inhale 2 puffs into the lungs every 4 (four) hours as needed for wheezing or shortness of breath. 07/19/15   Sharman Cheek, MD  albuterol (PROVENTIL) (2.5 MG/3ML) 0.083% nebulizer solution Take 3 mLs (2.5 mg total) by nebulization every 6 (six) hours as needed for wheezing or shortness of breath. 01/28/16   Blane Ohara, MD  cyclobenzaprine (FLEXERIL) 10 MG tablet Take 1 tablet (10 mg total) by mouth every 8 (eight) hours as needed for muscle spasms. 06/02/15   Charmayne Sheer Beers, PA-C  ibuprofen (ADVIL,MOTRIN) 800 MG tablet Take 1 tablet (800 mg total) by mouth every 8 (eight) hours as needed. 06/02/15   Charmayne Sheer Beers, PA-C  lurasidone (LATUDA) 80 MG TABS tablet Take 80 mg by mouth at  bedtime.    Historical Provider, MD     Allergies Patient has no known allergies.  No family history on file.  Social History Social History  Substance Use Topics  . Smoking status: Current Every Day Smoker    Packs/day: 1.00    Types: Cigarettes  . Smokeless tobacco: Never Used  . Alcohol use No    Review of Systems  Constitutional: No dizziness Eyes: No visual changes.  ENT: No sore throat.  Respiratory: Denies shortness of breath. Positive cough Gastrointestinal: No pain today  Genitourinary: Negative for dysuria. No vaginal bleeding Musculoskeletal: Negative for back pain. Skin: Negative for rash.   10-point ROS otherwise negative.  ____________________________________________   PHYSICAL EXAM:  VITAL SIGNS: ED Triage Vitals  Enc Vitals Group     BP 02/12/16 2001 (!) 165/91     Pulse Rate 02/12/16 2001 77     Resp 02/12/16 2001 18     Temp 02/12/16 2001 98.4 F (36.9 C)     Temp Source 02/12/16 2001 Oral     SpO2 02/12/16 2001 99 %     Weight 02/12/16 2001 225 lb (102.1 kg)     Height 02/12/16 2001 5\' 2"  (1.575 m)     Head Circumference --      Peak Flow --      Pain Score 02/12/16 2002 8     Pain Loc --      Pain Edu? --      Excl. in GC? --     Constitutional: Alert and oriented.  No acute distress. Pleasant and interactive Eyes: Conjunctivae are normal.   Nose: No congestion/rhinnorhea. Mouth/Throat: Mucous membranes are moist.    Cardiovascular: Normal rate, regular rhythm. Grossly normal heart sounds.  Good peripheral circulation. Respiratory: Normal respiratory effort.  No retractions. Lungs CTAB. Gastrointestinal: Soft and nontender. No distention.  No CVA tenderness. Genitourinary: deferred Musculoskeletal: No lower extremity tenderness nor edema.  Warm and well perfused Neurologic:  Normal speech and language. No gross focal neurologic deficits are appreciated.  Skin:  Skin is warm, dry and intact. No rash noted. Psychiatric: Mood and  affect are normal. Speech and behavior are normal.  ____________________________________________   LABS (all labs ordered are listed, but only abnormal results are displayed)  Labs Reviewed  COMPREHENSIVE METABOLIC PANEL - Abnormal; Notable for the following:       Result Value   Sodium 134 (*)    Glucose, Bld 110 (*)    BUN <5 (*)    ALT 10 (*)    All other components within normal limits  URINALYSIS, COMPLETE (UACMP) WITH MICROSCOPIC - Abnormal; Notable for the following:    Color, Urine YELLOW (*)    APPearance CLEAR (*)    Squamous Epithelial / LPF 0-5 (*)    All other components within normal limits  POCT PREGNANCY, URINE - Abnormal; Notable for the following:    Preg Test, Ur POSITIVE (*)    All other components within normal limits  RAPID INFLUENZA A&B ANTIGENS ([facility] ONLY)  CBC  HCG, QUANTITATIVE, PREGNANCY   ____________________________________________  EKG  None ____________________________________________  RADIOLOGY  US ____________________________________________   PROCEDURES  Procedure(s) performed: No    Critical Care performed: No ____________________________________________   INITIAL IMPRESSION / ASSESSMENT AND PLAN / ED COURSE  Pertinent labs & imaging results that were available during my care of the patient were reviewed by me and considered in my medical decision making (see chart for details).  Patient well-appearing and in no acute distress. Her symptoms are most consistent with upper respiratory infection, likely viral. Vital signs unremarkable she is nontoxic in appearance. Her urine pregnancy is positive we will check a beta. Given her description of intermittent abdominal cramping she is requesting an ultrasound which we will perform  Clinical Course   Have asked my colleague to follow up on US ____________________________________________   FINAL CLINICAL IMPRESSION(S) / ED DIAGNOSES  Abdominal pain in pregnancy   NEW  MEDICATIONS STARTED DURING THIS VISIT:  New Prescriptions   No medications on file     Note:  This document was prepared using Dragon voice recognition software and may include unintentional dictation errors.    Jene Everyobert Batool Majid, MD 02/12/16 2250

## 2016-02-13 NOTE — ED Provider Notes (Signed)
-----------------------------------------   12:14 AM on 02/13/2016 -----------------------------------------  OB ultrasound interpreted per Dr. Margarita GrizzleWoodruff:  Single live intrauterine gestation with estimated gestational age of  6+ weeks. No subchorionic hemorrhage. Probable small hemorrhagic  corpus luteum on the right. Study otherwise unremarkable.   Updated patient on pelvic ultrasound results. Strict return precautions given. Patient verbalizes understanding and agrees with plan of care.    Irean HongJade J Marcus Schwandt, MD 02/13/16 [phone]

## 2016-02-13 NOTE — Discharge Instructions (Signed)
Return to the ER for worsening symptoms, persistent vomiting, vaginal bleeding, difficulty breathing or other concerns.

## 2017-03-17 ENCOUNTER — Encounter: Payer: Self-pay | Admitting: Emergency Medicine

## 2017-03-17 ENCOUNTER — Emergency Department
Admission: EM | Admit: 2017-03-17 | Discharge: 2017-03-17 | Disposition: A | Discharge status: Home / Self Care | Payer: Medicaid Other | Attending: Emergency Medicine | Admitting: Emergency Medicine

## 2017-03-17 ENCOUNTER — Emergency Department: Payer: Medicaid Other

## 2017-03-17 DIAGNOSIS — I10 Essential (primary) hypertension: Secondary | ICD-10-CM | POA: Diagnosis not present

## 2017-03-17 DIAGNOSIS — J209 Acute bronchitis, unspecified: Secondary | ICD-10-CM | POA: Insufficient documentation

## 2017-03-17 DIAGNOSIS — J4521 Mild intermittent asthma with (acute) exacerbation: Secondary | ICD-10-CM | POA: Diagnosis not present

## 2017-03-17 DIAGNOSIS — Z79899 Other long term (current) drug therapy: Secondary | ICD-10-CM | POA: Diagnosis not present

## 2017-03-17 DIAGNOSIS — F17210 Nicotine dependence, cigarettes, uncomplicated: Secondary | ICD-10-CM | POA: Diagnosis not present

## 2017-03-17 DIAGNOSIS — R05 Cough: Secondary | ICD-10-CM | POA: Diagnosis present

## 2017-03-17 MED ORDER — IPRATROPIUM-ALBUTEROL 0.5-2.5 (3) MG/3ML IN SOLN
3.0000 mL | Freq: Once | RESPIRATORY_TRACT | Status: AC
  Administered 2017-03-17: 3 mL via RESPIRATORY_TRACT

## 2017-03-17 MED ORDER — METHYLPREDNISOLONE SODIUM SUCC 125 MG IJ SOLR
125.0000 mg | Freq: Once | INTRAMUSCULAR | Status: AC
  Administered 2017-03-17: 125 mg via INTRAMUSCULAR
  Filled 2017-03-17: qty 2

## 2017-03-17 MED ORDER — IPRATROPIUM-ALBUTEROL 0.5-2.5 (3) MG/3ML IN SOLN
3.0000 mL | Freq: Once | RESPIRATORY_TRACT | Status: AC
  Administered 2017-03-17: 3 mL via RESPIRATORY_TRACT
  Filled 2017-03-17: qty 3

## 2017-03-17 MED ORDER — PROMETHAZINE-DM 6.25-15 MG/5ML PO SYRP: 5.0000 mL | ORAL_SOLUTION | Freq: Every evening | ORAL | 0 refills | Status: DC | PRN

## 2017-03-17 MED ORDER — ALBUTEROL SULFATE HFA 108 (90 BASE) MCG/ACT IN AERS: 2.0000 | INHALATION_SPRAY | Freq: Four times a day (QID) | RESPIRATORY_TRACT | Status: DC | PRN

## 2017-03-17 MED ORDER — IPRATROPIUM-ALBUTEROL 0.5-2.5 (3) MG/3ML IN SOLN
3.0000 mL | Freq: Once | RESPIRATORY_TRACT | Status: AC
  Administered 2017-03-17: 3 mL via RESPIRATORY_TRACT
  Filled 2017-03-17: qty 6

## 2017-03-17 NOTE — ED Triage Notes (Signed)
Pt reports dry brown productive cough, hx of asthma and out of inhaler. Pt is a smoker.

## 2017-03-17 NOTE — ED Notes (Signed)
NAD noted at time of D/C. Pt denies questions or concerns. Pt ambulatory to the lobby at this time.  

## 2017-03-17 NOTE — Discharge Instructions (Signed)
Please continue with prednisone 40 mg daily, albuterol 2 puffs every 6 hours as needed for wheezing.  Take cough medication as prescribed.  Return to the emergency department for any shortness of breath, worsening cough, fevers, urgent changes in her health.

## 2017-03-17 NOTE — ED Provider Notes (Signed)
[facility] EMERGENCY DEPARTMENT Provider Note   CSN: [id number] Arrival date & time: 03/17/17  1018     History   Chief Complaint Chief Complaint  Patient presents with  . Cough    HPI Andrea Hardy is a 37 y.o. female presents to the emergency department for evaluation of cough and shortness of breath.  Symptoms began Thursday, 3 days ago.  2 days ago she went to urgent care facility and was placed on prednisone and started on albuterol.  This morning she woke up with shortness of breath and chest tightness.  She denies any fevers or radicular symptoms.  She gets mild relief with albuterol.  Her cough is nonproductive.  She denies any body aches, nausea, vomiting or diarrhea.  HPI  Past Medical History:  Diagnosis Date  . Asthma   . Bipolar 1 disorder (HCC)   . Hypertension     Patient Active Problem List   Diagnosis Date Noted  . Family history of chromosomal microdeletion 02/28/2015  . Indication for care in labor and delivery, antepartum 01/07/2015  . Vaginal bleeding before [redacted] weeks gestation 01/07/2015    No past surgical history on file.  OB History    Gravida Para Term Preterm AB Living   4 0     3     SAB TAB Ectopic Multiple Live Births   1 2             Home Medications    Prior to Admission medications   Medication Sig Start Date End Date Taking? Authorizing Provider  albuterol (PROVENTIL HFA;VENTOLIN HFA) 108 (90 Base) MCG/ACT inhaler Inhale 2 puffs into the lungs every 6 (six) hours as needed for wheezing or shortness of breath. 03/17/17   Evon SlackGaines, Kymberly Blomberg C, PA-C  cyclobenzaprine (FLEXERIL) 10 MG tablet Take 1 tablet (10 mg total) by mouth every 8 (eight) hours as needed for muscle spasms. 06/02/15   Beers, Charmayne Sheerharles M, PA-C  ibuprofen (ADVIL,MOTRIN) 800 MG tablet Take 1 tablet (800 mg total) by mouth every 8 (eight) hours as needed. 06/02/15   Beers, Charmayne Sheerharles M, PA-C  lurasidone (LATUDA) 80 MG TABS tablet Take 80 mg by mouth at  bedtime.    [provider]  promethazine-dextromethorphan (PROMETHAZINE-DM) 6.25-15 MG/5ML syrup Take 5 mLs by mouth at bedtime as needed for cough. 03/17/17   Evon SlackGaines, Asiana Benninger C, PA-C    Family History No family history on file.  Social History Social History   Tobacco Use  . Smoking status: Current Every Day Smoker    Packs/day: 1.00    Types: Cigarettes  . Smokeless tobacco: Never Used  Substance Use Topics  . Alcohol use: No  . Drug use: Yes    Types: Marijuana    Comment: last use two weeks ago     Allergies   Patient has no known allergies.   Review of Systems Review of Systems  Constitutional: Negative for fever.  HENT: Positive for congestion, rhinorrhea and sore throat. Negative for ear discharge, sinus pressure, sinus pain, trouble swallowing and voice change.   Respiratory: Positive for cough, chest tightness, shortness of breath and wheezing. Negative for stridor.   Cardiovascular: Negative for chest pain.  Gastrointestinal: Negative for abdominal pain, diarrhea, nausea and vomiting.  Genitourinary: Negative for dysuria, flank pain and pelvic pain.  Musculoskeletal: Negative for back pain and myalgias.  Skin: Negative for rash.  Neurological: Negative for dizziness and headaches.     Physical Exam Updated Vital Signs BP (!) 152/80 (  BP Location: Right Wrist)   Pulse (!) 107   Temp 99.2 F (37.3 C) (Oral)   Resp (!) 22   Ht 5\' 2"  (1.575 m)   Wt 106.6 kg (235 lb)   LMP 02/24/2017 (Exact Date)   SpO2 (!) 87%   BMI 42.98 kg/m   Physical Exam  Constitutional: She is oriented to person, place, and time. She appears well-developed and well-nourished.  HENT:  Head: Normocephalic and atraumatic.  Right Ear: External ear normal.  Left Ear: External ear normal.  Mouth/Throat: Oropharynx is clear and moist. No oropharyngeal exudate.  Eyes: Conjunctivae are normal.  Neck: Normal range of motion.  Cardiovascular: Normal rate and normal heart sounds.   Pulmonary/Chest: Effort normal. No respiratory distress. She has wheezes.  Abdominal: Soft. She exhibits no distension and no mass. There is no tenderness. There is no rebound and no guarding.  Musculoskeletal: Normal range of motion.  Neurological: She is alert and oriented to person, place, and time.  Skin: Skin is warm. No rash noted.  Psychiatric: She has a normal mood and affect. Her behavior is normal. Judgment and thought content normal.     ED Treatments / Results  Labs (all labs ordered are listed, but only abnormal results are displayed) Labs Reviewed - No data to display  EKG  EKG Interpretation None       Radiology Dg Chest 2 View  Result Date: 03/17/2017 CLINICAL DATA:  Patient reports SOB x4-5 days. Reports some coughing. Denies CP or fever. Hx HTN, asthma. Current smoker. EXAM: CHEST  2 VIEW COMPARISON:  08/10/2010 FINDINGS: Normal cardiac silhouette. There is increased central venous congestion. Mild linear basilar opacities increased from prior. No pleural fluid. No focal consolidation. IMPRESSION: 1. Increased linear lower lobe opacities suggest bronchitis. No focal infiltrate. 1. Mild increase in central venous congestion. Electronically Signed   By: Genevive Bi M.D.   On: 03/17/2017 11:28    Procedures Procedures (including critical care time)  Medications Ordered in ED Medications  ipratropium-albuterol (DUONEB) 0.5-2.5 (3) MG/3ML nebulizer solution 3 mL (3 mLs Nebulization Given 03/17/17 1124)  ipratropium-albuterol (DUONEB) 0.5-2.5 (3) MG/3ML nebulizer solution 3 mL (3 mLs Nebulization Given 03/17/17 1118)  methylPREDNISolone sodium succinate (SOLU-MEDROL) 125 mg/2 mL injection 125 mg (125 mg Intramuscular Given 03/17/17 1117)  ipratropium-albuterol (DUONEB) 0.5-2.5 (3) MG/3ML nebulizer solution 3 mL (3 mLs Nebulization Given 03/17/17 1202)     Initial Impression / Assessment and Plan / ED Course  I have reviewed the triage vital signs and the nursing  notes.  Pertinent labs & imaging results that were available during my care of the patient were reviewed by me and considered in my medical decision making (see chart for details).     37 year old female with upper respiratory infection and exacerbation of asthma.  Chest x-ray reviewed by me today shows no evidence of acute cardiopulmonary process, she has findings consistent with bronchitis.  Upon arrival to the ED O2 sats were low and patient had decreased air movement with wheezing.  After 120 of Solu-Medrol IM and 3 breathing treatments patient's oxygen saturations improved to 93-96% on room air.  Patient states she feels much better.  She is advised to continue with prednisone 40 mg daily, continue with albuterol inhaler every 6 hours as needed.  She is educated on signs and symptoms return to the ED for.  Final Clinical Impressions(s) / ED Diagnoses   Final diagnoses:  Acute bronchitis, unspecified organism  Mild intermittent asthma with exacerbation  ED Discharge Orders        Ordered    albuterol (PROVENTIL HFA;VENTOLIN HFA) 108 (90 Base) MCG/ACT inhaler  Every 6 hours PRN     03/17/17 1421    promethazine-dextromethorphan (PROMETHAZINE-DM) 6.25-15 MG/5ML syrup  At bedtime PRN     03/17/17 1421       Evon Slack, PA-C 03/17/17 1427    Merrily Brittle, MD 03/18/17 [phone]

## 2017-03-17 NOTE — ED Notes (Signed)
Ambulated pt with pulse ox attached.  Pt c/o ShOB at times with ambulation.  Oxygen level dropped to 87% room air with ambulation.  Pt back to room, placed on 2 L of oxygen.  Oxygen came up to 96%.  Pt was taken off oxygen for about 5 minutes and oxygen level dropped down to 88% room air.  Per Thayer Ohmhris, [place], place pt on 2 L of oxygen, monitor pulse ox.

## 2017-03-17 NOTE — ED Notes (Signed)
PA made aware of patients O2 sats. PA made aware this RN placed patient on 2L via [place] due to O2 sats 87%.

## 2017-03-17 NOTE — ED Notes (Signed)
Pt states got sick on Thursday, was seen at Urgent care on Friday with c/o Asthma. Pt states has been taking albuterol without relief. Pt states was prescribed prednisone and tessalon. Pt denies fevers at this time. Pt with noted strong cough, states has been coughing up brown phlegm, hx of asthma, current smoker. Pt c/o shob, worse with walking.

## 2017-10-21 ENCOUNTER — Emergency Department: Payer: Medicaid Other

## 2017-10-21 ENCOUNTER — Other Ambulatory Visit: Payer: Self-pay

## 2017-10-21 ENCOUNTER — Emergency Department
Admission: EM | Admit: 2017-10-21 | Discharge: 2017-10-21 | Disposition: A | Discharge status: Home / Self Care | Payer: Medicaid Other | Attending: Emergency Medicine | Admitting: Emergency Medicine

## 2017-10-21 DIAGNOSIS — J441 Chronic obstructive pulmonary disease with (acute) exacerbation: Secondary | ICD-10-CM | POA: Diagnosis not present

## 2017-10-21 DIAGNOSIS — I10 Essential (primary) hypertension: Secondary | ICD-10-CM | POA: Diagnosis not present

## 2017-10-21 DIAGNOSIS — Z79899 Other long term (current) drug therapy: Secondary | ICD-10-CM | POA: Insufficient documentation

## 2017-10-21 DIAGNOSIS — F319 Bipolar disorder, unspecified: Secondary | ICD-10-CM | POA: Diagnosis not present

## 2017-10-21 DIAGNOSIS — J40 Bronchitis, not specified as acute or chronic: Secondary | ICD-10-CM

## 2017-10-21 DIAGNOSIS — F17210 Nicotine dependence, cigarettes, uncomplicated: Secondary | ICD-10-CM | POA: Diagnosis not present

## 2017-10-21 DIAGNOSIS — R0602 Shortness of breath: Secondary | ICD-10-CM | POA: Diagnosis present

## 2017-10-21 HISTORY — DX: Bronchitis, not specified as acute or chronic: J40

## 2017-10-21 LAB — COMPREHENSIVE METABOLIC PANEL
ALBUMIN: 4.1 g/dL (ref 3.5–5.0)
ALK PHOS: 51 U/L (ref 38–126)
ALT: 12 U/L (ref 0–44)
AST: 16 U/L (ref 15–41)
Anion gap: 7 (ref 5–15)
BUN: 6 mg/dL (ref 6–20)
CALCIUM: 8.8 mg/dL — AB (ref 8.9–10.3)
CO2: 25 mmol/L (ref 22–32)
CREATININE: 0.58 mg/dL (ref 0.44–1.00)
Chloride: 106 mmol/L (ref 98–111)
GFR calc Af Amer: >60 mL/min (ref >60)
GFR calc non Af Amer: >60 mL/min (ref >60)
GLUCOSE: 96 mg/dL (ref 70–99)
Potassium: 3.3 mmol/L — ABNORMAL LOW (ref 3.5–5.1)
SODIUM: 138 mmol/L (ref 135–145)
Total Bilirubin: 1.2 mg/dL (ref 0.3–1.2)
Total Protein: 7.5 g/dL (ref 6.5–8.1)

## 2017-10-21 LAB — CBC
HCT: 40.5 % (ref 35.0–47.0)
Hemoglobin: 14 g/dL (ref 12.0–16.0)
MCH: 29.4 pg (ref 26.0–34.0)
MCHC: 34.7 g/dL (ref 32.0–36.0)
MCV: 84.7 fL (ref 80.0–100.0)
Platelets: 236 10*3/uL (ref 150–440)
RBC: 4.78 MIL/uL (ref 3.80–5.20)
RDW: 15.3 % — ABNORMAL HIGH (ref 11.5–14.5)
WBC: 5.7 10*3/uL (ref 3.6–11.0)

## 2017-10-21 MED ORDER — METHYLPREDNISOLONE SODIUM SUCC 125 MG IJ SOLR
125.0000 mg | Freq: Once | INTRAMUSCULAR | Status: AC
  Administered 2017-10-21: 125 mg via INTRAVENOUS
  Filled 2017-10-21: qty 2

## 2017-10-21 MED ORDER — IPRATROPIUM-ALBUTEROL 0.5-2.5 (3) MG/3ML IN SOLN
3.0000 mL | Freq: Once | RESPIRATORY_TRACT | Status: AC
Start: 2017-10-21 — End: 2017-10-21
  Administered 2017-10-21: 3 mL via RESPIRATORY_TRACT
  Filled 2017-10-21: qty 3

## 2017-10-21 MED ORDER — PREDNISONE 50 MG PO TABS: 50.0000 mg | ORAL_TABLET | Freq: Every day | ORAL | 0 refills | Status: DC

## 2017-10-21 MED ORDER — IPRATROPIUM-ALBUTEROL 0.5-2.5 (3) MG/3ML IN SOLN
3.0000 mL | Freq: Once | RESPIRATORY_TRACT | Status: AC
  Administered 2017-10-21: 3 mL via RESPIRATORY_TRACT
  Filled 2017-10-21: qty 3

## 2017-10-21 NOTE — ED Provider Notes (Signed)
[facility] Emergency Department Provider Note   ____________________________________________    I have reviewed the triage vital signs and the nursing notes.   HISTORY  Chief Complaint Shortness of Breath     HPI Andrea Hardy is a 37 y.o. female who presents with complaints of shortness of breath.  Patient reports a history of COPD.  She continues to smoke cigarettes.  She reports over the last 24 to 36 hours she has had worsening shortness of breath.  She called EMS this morning when she felt quite short of breath, before they arrived she taken a DuoNeb with some improvement.  Additional albuterol given by EMS, now she feels somewhat better but continues to have wheezing.  Denies cough fevers chills.  No recent travel.  No calf pain or swelling   Past Medical History:  Diagnosis Date  . Asthma   . Bipolar 1 disorder (HCC)   . Bronchitis 10/21/2017  . Hypertension     Patient Active Problem List   Diagnosis Date Noted  . Family history of chromosomal microdeletion 02/28/2015  . Indication for care in labor and delivery, antepartum 01/07/2015  . Vaginal bleeding before [redacted] weeks gestation 01/07/2015    No past surgical history on file.  Prior to Admission medications   Medication Sig Start Date End Date Taking? Authorizing Provider  albuterol (PROVENTIL HFA;VENTOLIN HFA) 108 (90 Base) MCG/ACT inhaler Inhale 2 puffs into the lungs every 6 (six) hours as needed for wheezing or shortness of breath. 03/17/17   Evon SlackGaines, Thomas C, PA-C  cyclobenzaprine (FLEXERIL) 10 MG tablet Take 1 tablet (10 mg total) by mouth every 8 (eight) hours as needed for muscle spasms. 06/02/15   Beers, Charmayne Sheerharles M, PA-C  ibuprofen (ADVIL,MOTRIN) 800 MG tablet Take 1 tablet (800 mg total) by mouth every 8 (eight) hours as needed. 06/02/15   Beers, Charmayne Sheerharles M, PA-C  lurasidone (LATUDA) 80 MG TABS tablet Take 80 mg by mouth at bedtime.    [provider]  predniSONE  (DELTASONE) 50 MG tablet Take 1 tablet (50 mg total) by mouth daily with breakfast. 10/21/17   Jene EveryKinner, Ramina Hulet, MD  promethazine-dextromethorphan (PROMETHAZINE-DM) 6.25-15 MG/5ML syrup Take 5 mLs by mouth at bedtime as needed for cough. 03/17/17   Evon SlackGaines, Thomas C, PA-C     Allergies Patient has no known allergies.  No family history on file.  Social History Social History   Tobacco Use  . Smoking status: Current Every Day Smoker    Packs/day: 1.00    Types: Cigarettes  . Smokeless tobacco: Never Used  Substance Use Topics  . Alcohol use: No  . Drug use: Yes    Types: Marijuana    Comment: last use two weeks ago    Review of Systems  Constitutional: No fever/chills Eyes: No visual changes.  ENT: No sore throat. Cardiovascular: Denies chest pain. Respiratory: As above. Gastrointestinal: No abdominal pain.   Genitourinary: Negative for dysuria. Musculoskeletal: Negative for back pain. Skin: Negative for rash. Neurological: Negative for headaches or weakness   ____________________________________________   PHYSICAL EXAM:  VITAL SIGNS: ED Triage Vitals  Enc Vitals Group     BP 10/21/17 1236 (!) 160/93     Pulse Rate 10/21/17 1236 77     Resp --      Temp 10/21/17 1236 98.4 F (36.9 C)     Temp Source 10/21/17 1236 Oral     SpO2 10/21/17 1236 99 %     Weight 10/21/17 1237 104.3 kg (  230 lb)     Height 10/21/17 1237 1.575 m (5\' 2" )     Head Circumference --      Peak Flow --      Pain Score 10/21/17 1237 9     Pain Loc --      Pain Edu? --      Excl. in GC? --     Constitutional: Alert and oriented.  Eyes: Conjunctivae are normal.   Nose: No congestion/rhinnorhea. Mouth/Throat: Mucous membranes are moist.    Cardiovascular: Normal rate, regular rhythm. Grossly normal heart sounds.  Good peripheral circulation. Respiratory: Mildly increased work of breathing, with tachypnea, diffuse wheezing with poor airflow Gastrointestinal: Soft and nontender. No  distention.   Genitourinary: deferred Musculoskeletal: No lower extremity tenderness nor edema.  Warm and well perfused Neurologic:  Normal speech and language. No gross focal neurologic deficits are appreciated.  Skin:  Skin is warm, dry and intact. No rash noted. Psychiatric: Mood and affect are normal. Speech and behavior are normal.  ____________________________________________   LABS (all labs ordered are listed, but only abnormal results are displayed)  Labs Reviewed  CBC - Abnormal; Notable for the following components:      Result Value   RDW 15.3 (*)    All other components within normal limits  COMPREHENSIVE METABOLIC PANEL - Abnormal; Notable for the following components:   Potassium 3.3 (*)    Calcium 8.8 (*)    All other components within normal limits   ____________________________________________  EKG  None ____________________________________________  RADIOLOGY  Chest x-ray pending ____________________________________________   PROCEDURES  Procedure(s) performed: No  Procedures   Critical Care performed: No ____________________________________________   INITIAL IMPRESSION / ASSESSMENT AND PLAN / ED COURSE  Pertinent labs & imaging results that were available during my care of the patient were reviewed by me and considered in my medical decision making (see chart for details).  Patient presents with shortness of breath as noted above, exam is consistent with bronchospasm likely related to COPD.  We will treat with additional nebulizer's, Solu-Medrol, obtain chest x-ray and labs and reevaluate   ----------------------------------------- 2:12 PM on 10/21/2017 -----------------------------------------  On reevaluation patient is feeling much better, is are clear to auscultation, appropriate for discharge at this time with p.o. steroids, return precautions discussed.  Smoking cessation discussed at length with the patient      ____________________________________________   FINAL CLINICAL IMPRESSION(S) / ED DIAGNOSES  Final diagnoses:  COPD exacerbation (HCC)        Note:  This document was prepared using Dragon voice recognition software and may include unintentional dictation errors.    Jene Every, MD 10/21/17 [phone]

## 2017-10-21 NOTE — ED Triage Notes (Signed)
Per EMS pt was home, began having difficulty breathing.  Pt administered an albuterol treatment to self about 0900 (Hx of asthma, bronchitis, HTN), with not a lot of improvement.  Called EMS.  EMS gave one duoneb in route.  Sats were 93-98%.  189/110, 78- NSR.

## 2020-01-06 ENCOUNTER — Emergency Department: Payer: Medicaid Other

## 2020-01-06 ENCOUNTER — Emergency Department
Admission: EM | Admit: 2020-01-06 | Discharge: 2020-01-06 | Disposition: A | Discharge status: Home / Self Care | Payer: Medicaid Other | Attending: Emergency Medicine | Admitting: Emergency Medicine

## 2020-01-06 ENCOUNTER — Other Ambulatory Visit: Payer: Self-pay

## 2020-01-06 DIAGNOSIS — Z79899 Other long term (current) drug therapy: Secondary | ICD-10-CM | POA: Diagnosis not present

## 2020-01-06 DIAGNOSIS — Z20822 Contact with and (suspected) exposure to covid-19: Secondary | ICD-10-CM | POA: Insufficient documentation

## 2020-01-06 DIAGNOSIS — I10 Essential (primary) hypertension: Secondary | ICD-10-CM | POA: Diagnosis not present

## 2020-01-06 DIAGNOSIS — J441 Chronic obstructive pulmonary disease with (acute) exacerbation: Secondary | ICD-10-CM | POA: Diagnosis not present

## 2020-01-06 DIAGNOSIS — J45901 Unspecified asthma with (acute) exacerbation: Secondary | ICD-10-CM | POA: Diagnosis present

## 2020-01-06 DIAGNOSIS — F17210 Nicotine dependence, cigarettes, uncomplicated: Secondary | ICD-10-CM | POA: Insufficient documentation

## 2020-01-06 LAB — RESP PANEL BY RT-PCR (FLU A&B, COVID) ARPGX2
Influenza A by PCR: NEGATIVE
Influenza B by PCR: NEGATIVE
SARS Coronavirus 2 by RT PCR: NEGATIVE

## 2020-01-06 MED ORDER — ALBUTEROL SULFATE (2.5 MG/3ML) 0.083% IN NEBU: 2.5000 mg | INHALATION_SOLUTION | RESPIRATORY_TRACT | 2 refills | Status: DC | PRN

## 2020-01-06 MED ORDER — PREDNISONE 20 MG PO TABS
60.0000 mg | ORAL_TABLET | Freq: Once | ORAL | Status: AC
  Administered 2020-01-06: 60 mg via ORAL
  Filled 2020-01-06: qty 3

## 2020-01-06 MED ORDER — AZITHROMYCIN 250 MG PO TABS: ORAL_TABLET | ORAL | 0 refills | Status: AC

## 2020-01-06 MED ORDER — IPRATROPIUM-ALBUTEROL 0.5-2.5 (3) MG/3ML IN SOLN
3.0000 mL | Freq: Once | RESPIRATORY_TRACT | Status: AC
  Administered 2020-01-06: 3 mL via RESPIRATORY_TRACT
  Filled 2020-01-06: qty 3

## 2020-01-06 MED ORDER — SEREVENT DISKUS 50 MCG/DOSE IN AEPB: 1.0000 | INHALATION_SPRAY | Freq: Two times a day (BID) | RESPIRATORY_TRACT | 12 refills | Status: AC

## 2020-01-06 MED ORDER — PREDNISONE 10 MG PO TABS: 40.0000 mg | ORAL_TABLET | Freq: Every day | ORAL | 0 refills | Status: AC

## 2020-01-06 NOTE — ED Triage Notes (Signed)
Pt here with asthma and congestion that started Friday. Pt uses an inhaler but is in need of another one. Pt NAD in triage.

## 2020-01-08 NOTE — ED Provider Notes (Signed)
[facility] Emergency Department Provider Note  ____________________________________________   First MD Initiated Contact with Patient 01/06/20 1951     (approximate)  I have reviewed the triage vital signs and the nursing notes.   HISTORY  Chief Complaint Asthma and Nasal Congestion  HPI Andrea Hardy is a 39 y.o. female reports to the emergency department for evaluation of acute illness.  The patient states that she is having an asthma exacerbation with congestion and cough that began 5 days ago.  She has used her asthma inhaler but has not had significant relief and is now out of her asthma medication.  She states that she tried using a nephews asthma inhaler that had a steroid in it but feels that this worsened her cough.  She states that when this occurs, she usually has to take p.o. steroid to calm it down.  She denies any fevers, headache, chills or chest pain.  She denies any recent known sick contacts.        Past Medical History:  Diagnosis Date  . Asthma   . Bipolar 1 disorder (HCC)   . Bronchitis 10/21/2017  . Hypertension     Patient Active Problem List   Diagnosis Date Noted  . Family history of chromosomal microdeletion 02/28/2015  . Indication for care in labor and delivery, antepartum 01/07/2015  . Vaginal bleeding before [redacted] weeks gestation 01/07/2015    No past surgical history on file.  Prior to Admission medications   Medication Sig Start Date End Date Taking? Authorizing Provider  albuterol (PROVENTIL HFA;VENTOLIN HFA) 108 (90 Base) MCG/ACT inhaler Inhale 2 puffs into the lungs every 6 (six) hours as needed for wheezing or shortness of breath. 03/17/17   Evon Slack, PA-C  albuterol (PROVENTIL) (2.5 MG/3ML) 0.083% nebulizer solution Take 3 mLs (2.5 mg total) by nebulization every 4 (four) hours as needed for wheezing or shortness of breath. 01/06/20 01/05/21  Lucy Chris, PA  azithromycin (ZITHROMAX Z-PAK) 250 MG tablet  Take 2 tablets (500 mg) on  Day 1,  followed by 1 tablet (250 mg) once daily on Days 2 through 5. 01/06/20 01/11/20  Lucy Chris, PA  cyclobenzaprine (FLEXERIL) 10 MG tablet Take 1 tablet (10 mg total) by mouth every 8 (eight) hours as needed for muscle spasms. 06/02/15   Beers, Charmayne Sheer, PA-C  ibuprofen (ADVIL,MOTRIN) 800 MG tablet Take 1 tablet (800 mg total) by mouth every 8 (eight) hours as needed. 06/02/15   Beers, Charmayne Sheer, PA-C  lurasidone (LATUDA) 80 MG TABS tablet Take 80 mg by mouth at bedtime.    [provider]  predniSONE (DELTASONE) 10 MG tablet Take 4 tablets (40 mg total) by mouth daily for 5 days. 01/06/20 01/11/20  Lucy Chris, PA  promethazine-dextromethorphan (PROMETHAZINE-DM) 6.25-15 MG/5ML syrup Take 5 mLs by mouth at bedtime as needed for cough. 03/17/17   Evon Slack, PA-C  salmeterol (SEREVENT DISKUS) 50 MCG/DOSE diskus inhaler Inhale 1 puff into the lungs in the morning and at bedtime. 01/06/20   Lucy Chris, PA    Allergies Patient has no known allergies.  No family history on file.  Social History Social History   Tobacco Use  . Smoking status: Current Every Day Smoker    Packs/day: 1.00    Types: Cigarettes  . Smokeless tobacco: Never Used  Substance Use Topics  . Alcohol use: No  . Drug use: Yes    Types: Marijuana    Comment: last use two  weeks ago    Review of Systems Constitutional: No fever/chills Eyes: No visual changes. ENT: + sore throat. Cardiovascular: Denies chest pain. Respiratory: + shortness of breath, + cough Gastrointestinal: No abdominal pain.  No nausea, no vomiting.  No diarrhea.  No constipation. Genitourinary: Negative for dysuria. Musculoskeletal: Negative for back pain. Skin: Negative for rash. Neurological: Negative for headaches, focal weakness or numbness.  ____________________________________________   PHYSICAL EXAM:  VITAL SIGNS: ED Triage Vitals [01/06/20 1751]  Enc Vitals Group      BP (!) 157/87     Pulse Rate 95     Resp 16     Temp 99.3 F (37.4 C)     Temp Source Oral     SpO2 97 %     Weight 220 lb (99.8 kg)     Height 5\' 2"  (1.575 m)     Head Circumference      Peak Flow      Pain Score 4     Pain Loc      Pain Edu?      Excl. in GC?    Constitutional: Alert and oriented. Well appearing and in no acute distress. Eyes: Conjunctivae are normal. PERRL. EOMI. Head: Atraumatic. Nose: Mild congestion/rhinnorhea. Mouth/Throat: Mucous membranes are moist.  Oropharynx erythematous without any tonsillar exudate or enlargement.. Neck: No stridor.   Lymphatic: No cervical lymphadenopathy. Cardiovascular: Normal rate, regular rhythm. Grossly normal heart sounds.  Good peripheral circulation. Respiratory: Normal respiratory effort.  No retractions.  Wheezing heard throughout all lung fields, worse in the bases bilaterally.  No rhonchi or crackles noted. Gastrointestinal: Soft and nontender. No distention. No abdominal bruits. No CVA tenderness. Musculoskeletal: No lower extremity tenderness nor edema.  No joint effusions. Neurologic:  Normal speech and language. No gross focal neurologic deficits are appreciated. No gait instability. Skin:  Skin is warm, dry and intact. No rash noted. Psychiatric: Mood and affect are normal. Speech and behavior are normal.  ____________________________________________   LABS (all labs ordered are listed, but only abnormal results are displayed)  Labs Reviewed  RESP PANEL BY RT-PCR (FLU A&B, COVID) [id number]   ____________________________________________  RADIOLOGY I, , personally viewed and evaluated these images (plain radiographs) as part of my medical decision making, as well as reviewing the written report by the radiologist.  ED provider interpretation: No acute pneumonia noted.   Official radiology report(s): IMPRESSION:  Diffuse airways thickening and interstitial opacity, slightly  increased from  more chronic changes seen on comparison imaging.  Findings could reflect acute asthma exacerbation or bronchitis.    ____________________________________________   INITIAL IMPRESSION / ASSESSMENT AND PLAN / ED COURSE  As part of my medical decision making, I reviewed the following data within the electronic MEDICAL RECORD NUMBER Nursing notes reviewed and incorporated, Labs reviewed, Radiograph reviewed  and Notes from prior ED visits        Patient is a 39 year old female with past medical history significant for COPD who reports to the emergency department for worsening symptoms despite her typical medications.  She is also out of her medicine reportedly.  She primarily complains of cough with intermittent shortness of breath that is worse with activity.  On physical exam, she has wheezing heard in all of the lung fields without any crackles or rhonchi suspicious for pneumonia.  Chest x-ray does not demonstrate any focal pneumonia however there is thickening of the airways consistent with asthma exacerbation versus bronchitis.  In the ER, we treated her with a DuoNeb  which she reports greatly improved her cough.  Review of ER note from 10/21/2017 reveals similar findings at that time and she reports that she received great relief during that episode.  We will manage this as a COPD exacerbation with steroid taper, refill of asthma medications as well as a Zithromax for coverage of atypical pneumonia.  The patient is in agreement with this plan and will return to the emergency department if she experiences any worsening of her symptoms.  She will otherwise follow-up with primary care.  Patient stable at this time for discharge.      ____________________________________________   FINAL CLINICAL IMPRESSION(S) / ED DIAGNOSES  Final diagnoses:  COPD exacerbation [facility])     ED Discharge Orders         Ordered    salmeterol (SEREVENT DISKUS) 50 MCG/DOSE diskus inhaler  2 times daily         01/06/20 2115    predniSONE (DELTASONE) 10 MG tablet  Daily        01/06/20 2115    albuterol (PROVENTIL) (2.5 MG/3ML) 0.083% nebulizer solution  Every 4 hours PRN        01/06/20 2115    azithromycin (ZITHROMAX Z-PAK) 250 MG tablet        01/06/20 2115          *Please note:  Cristie Mckinney was evaluated in Emergency Department on 01/08/2020 for the symptoms described in the history of present illness. She was evaluated in the context of the global COVID-19 pandemic, which necessitated consideration that the patient might be at risk for infection with the SARS-CoV-2 virus that causes COVID-19. Institutional protocols and algorithms that pertain to the evaluation of patients at risk for COVID-19 are in a state of rapid change based on information released by regulatory bodies including the CDC and federal and state organizations. These policies and algorithms were followed during the patient's care in the ED.  Some ED evaluations and interventions may be delayed as a result of limited staffing during and the pandemic.*   Note:  This document was prepared using Dragon voice recognition software and may include unintentional dictation errors.    Lucy Chris, PA 01/08/20 1534    Chesley Noon, MD 01/11/20 [phone]

## 2020-04-13 ENCOUNTER — Encounter: Payer: Self-pay | Admitting: *Deleted

## 2020-04-13 ENCOUNTER — Other Ambulatory Visit: Payer: Self-pay

## 2020-04-13 ENCOUNTER — Emergency Department
Admission: EM | Admit: 2020-04-13 | Discharge: 2020-04-13 | Disposition: A | Discharge status: Home / Self Care | Payer: Medicaid Other | Attending: Emergency Medicine | Admitting: Emergency Medicine

## 2020-04-13 DIAGNOSIS — M62838 Other muscle spasm: Secondary | ICD-10-CM | POA: Diagnosis not present

## 2020-04-13 DIAGNOSIS — Z79899 Other long term (current) drug therapy: Secondary | ICD-10-CM | POA: Insufficient documentation

## 2020-04-13 DIAGNOSIS — F17210 Nicotine dependence, cigarettes, uncomplicated: Secondary | ICD-10-CM | POA: Insufficient documentation

## 2020-04-13 DIAGNOSIS — J45909 Unspecified asthma, uncomplicated: Secondary | ICD-10-CM | POA: Insufficient documentation

## 2020-04-13 DIAGNOSIS — I10 Essential (primary) hypertension: Secondary | ICD-10-CM | POA: Diagnosis not present

## 2020-04-13 DIAGNOSIS — M542 Cervicalgia: Secondary | ICD-10-CM | POA: Diagnosis present

## 2020-04-13 MED ORDER — CYCLOBENZAPRINE HCL 10 MG PO TABS
10.0000 mg | ORAL_TABLET | Freq: Once | ORAL | Status: AC
  Administered 2020-04-13: 10 mg via ORAL
  Filled 2020-04-13: qty 1

## 2020-04-13 MED ORDER — CYCLOBENZAPRINE HCL 5 MG PO TABS: 5.0000 mg | ORAL_TABLET | Freq: Three times a day (TID) | ORAL | 0 refills | Status: DC | PRN

## 2020-04-13 MED ORDER — IBUPROFEN 800 MG PO TABS
800.0000 mg | ORAL_TABLET | Freq: Once | ORAL | Status: AC
  Administered 2020-04-13: 800 mg via ORAL
  Filled 2020-04-13: qty 1

## 2020-04-13 MED ORDER — IBUPROFEN 800 MG PO TABS: 800.0000 mg | ORAL_TABLET | Freq: Three times a day (TID) | ORAL | 0 refills | Status: DC | PRN

## 2020-04-13 NOTE — ED Provider Notes (Signed)
[facility] Emergency Department Provider Note ____________________________________________  Time seen: 1536  I have reviewed the triage vital signs and the nursing notes.  HISTORY  Chief Complaint  Torticollis  HPI Andrea Hardy is a 40 y.o. female presents herself to the ED for evaluation of left-sided neck pain.   Patient reports waking up this morning with left shoulder neck pain, but upon further questioning she realized she actually had the onset of discomfort yesterday after work.  She denies any recent traumas or falls.  He also denies any distal paresthesias, grip changes, or chest pain.  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  She relates her muscle stiffness and soreness to work activities including using a hand water trigger, and having to reach up overhead, to reach top of vehicles.  She denies any other injury or concerns.  She has not taken any medications in the interim for symptom relief.  Past Medical History:  Diagnosis Date  . Asthma   . Bipolar 1 disorder (HCC)   . Bronchitis 10/21/2017  . Hypertension     Patient Active Problem List   Diagnosis Date Noted  . Family history of chromosomal microdeletion 02/28/2015  . Indication for care in labor and delivery, antepartum 01/07/2015  . Vaginal bleeding before [redacted] weeks gestation 01/07/2015    History reviewed. No pertinent surgical history.  Prior to Admission medications   Medication Sig Start Date End Date Taking? Authorizing Provider  cyclobenzaprine (FLEXERIL) 5 MG tablet Take 1 tablet (5 mg total) by mouth 3 (three) times daily as needed. 04/13/20  Yes Everlene Cunning, Charlesetta Ivory, PA-C  ibuprofen (ADVIL) 800 MG tablet Take 1 tablet (800 mg total) by mouth every 8 (eight) hours as needed. 04/13/20  Yes Undrea Shipes, Charlesetta Ivory, PA-C  albuterol (PROVENTIL HFA;VENTOLIN HFA) 108 (90 Base) MCG/ACT inhaler Inhale 2 puffs into the lungs every 6  (six) hours as needed for wheezing or shortness of breath. 03/17/17   Evon Slack, PA-C  albuterol (PROVENTIL) (2.5 MG/3ML) 0.083% nebulizer solution Take 3 mLs (2.5 mg total) by nebulization every 4 (four) hours as needed for wheezing or shortness of breath. 01/06/20 01/05/21  Lucy Chris, PA  lurasidone (LATUDA) 80 MG TABS tablet Take 80 mg by mouth at bedtime.    [provider]  promethazine-dextromethorphan (PROMETHAZINE-DM) 6.25-15 MG/5ML syrup Take 5 mLs by mouth at bedtime as needed for cough. 03/17/17   Evon Slack, PA-C  salmeterol (SEREVENT DISKUS) 50 MCG/DOSE diskus inhaler Inhale 1 puff into the lungs in the morning and at bedtime. 01/06/20   Lucy Chris, PA    Allergies Patient has no known allergies.  History reviewed. No pertinent family history.  Social History Social History   Tobacco Use  . Smoking status: Current Every Day Smoker    Packs/day: 1.00    Types: Cigarettes  . Smokeless tobacco: Never Used  Substance Use Topics  . Alcohol use: No  . Drug use: Yes    Types: Marijuana    Comment: last use two weeks ago    Review of Systems  Constitutional: Negative for fever. Eyes: Negative for visual changes. ENT: Negative for sore throat. Cardiovascular: Negative for chest pain. Respiratory: Negative for shortness of breath. Gastrointestinal: Negative for abdominal pain, vomiting and diarrhea. Genitourinary: Negative for dysuria. Musculoskeletal: Negative for back pain.  Positive for left neck and upper back pain Skin: Negative for rash. Neurological: Negative for  headaches, focal weakness or numbness. ____________________________________________  PHYSICAL EXAM:  VITAL SIGNS: ED Triage Vitals  Enc Vitals Group     BP 04/13/20 1517 (!) 157/93     Pulse Rate 04/13/20 1517 63     Resp 04/13/20 1517 20     Temp 04/13/20 1517 98.7 F (37.1 C)     Temp Source 04/13/20 1517 Oral     SpO2 04/13/20 1517 95 %     Weight 04/13/20  1518 220 lb (99.8 kg)     Height 04/13/20 1518 5\' 2"  (1.575 m)     Head Circumference --      Peak Flow --      Pain Score 04/13/20 1518 8     Pain Loc --      Pain Edu? --      Excl. in GC? --     Constitutional: Alert and oriented. Well appearing and in no distress. Head: Normocephalic and atraumatic. Eyes: Conjunctivae are normal. Normal extraocular movements Neck: Supple.  Normal range of motion without crepitus.  No distracting midline tenderness is noted.  Patient with tenderness to palpation to the left trapezius musculature. Hematological/Lymphatic/Immunological: No cervical lymphadenopathy. Cardiovascular: Normal rate, regular rhythm. Normal distal pulses. Respiratory: Normal respiratory effort. No wheezes/rales/rhonchi. Musculoskeletal: Normal spinal alignment without midline tenderness, spasm, deformity, or step-off.  Nontender with normal range of motion in all extremities.  Neurologic: Cranial nerves II through XII grossly intact.  Normal UE DTRs bilaterally.  Normal gait without ataxia. Normal speech and language. No gross focal neurologic deficits are appreciated. Skin:  Skin is warm, dry and intact. No rash noted. Psychiatric: Mood and affect are normal. Patient exhibits appropriate insight and judgment. ____________________________________________  PROCEDURES  IBU 800 mg PO Flexeril 10 mg PO  Procedures ____________________________________________  INITIAL IMPRESSION / ASSESSMENT AND PLAN / ED COURSE  DDX: torticollis, rotator cuff tendinitis, radiculopathy  Patient with ED evaluation of left-sided neck and upper back muscle pain.  Patient without any red flags on exam.  Normal active range of motion of the upper extremity.  Normal composite fist and intrinsic testing.  No signs of acute neuromuscular deficit.  No distracting tenderness on exam.  No trauma to precipitate symptoms.  Symptoms likely represent myalgias related to work activities.  Patient is  discharged with a prescription for an anti-inflammatory muscle relaxer to take as directed patient is also encouraged to take frequent breaks, and to stretch prior to and during her work shift.  She will follow up with a local provider or return to the ED if needed.   Monroe Toure was evaluated in Emergency Department on 04/13/2020 for the symptoms described in the history of present illness. She was evaluated in the context of the global COVID-19 pandemic, which necessitated consideration that the patient might be at risk for infection with the SARS-CoV-2 virus that causes COVID-19. Institutional protocols and algorithms that pertain to the evaluation of patients at risk for COVID-19 are in a state of rapid change based on information released by regulatory bodies including the CDC and federal and state organizations. These policies and algorithms were followed during the patient's care in the ED. ____________________________________________  FINAL CLINICAL IMPRESSION(S) / ED DIAGNOSES  Final diagnoses:  Trapezius muscle spasm      Lonn Im, 06/13/2020, PA-C 04/13/20 2121    2122, MD 04/13/20 2245

## 2020-04-13 NOTE — ED Triage Notes (Signed)
Pt ambulatory to triage.  Pt reports waking up this morning with left shoulder and neck pain.  No known injury.  Good rom of left arm.  No chest pain or sob.  Pt alert  Speech clear

## 2020-04-13 NOTE — Discharge Instructions (Signed)
Your exam is normal. There is no evidence of a serious injury or nerve injury. Take the prescription meds as directed. Follow-up as needed.

## 2020-08-14 ENCOUNTER — Emergency Department
Admission: EM | Admit: 2020-08-14 | Discharge: 2020-08-14 | Disposition: A | Discharge status: Home / Self Care | Payer: Medicaid Other | Attending: Emergency Medicine | Admitting: Emergency Medicine

## 2020-08-14 ENCOUNTER — Emergency Department: Payer: Medicaid Other

## 2020-08-14 ENCOUNTER — Other Ambulatory Visit: Payer: Self-pay

## 2020-08-14 DIAGNOSIS — R059 Cough, unspecified: Secondary | ICD-10-CM | POA: Diagnosis present

## 2020-08-14 DIAGNOSIS — Z5321 Procedure and treatment not carried out due to patient leaving prior to being seen by health care provider: Secondary | ICD-10-CM | POA: Insufficient documentation

## 2020-08-14 DIAGNOSIS — J45909 Unspecified asthma, uncomplicated: Secondary | ICD-10-CM | POA: Insufficient documentation

## 2020-08-14 NOTE — ED Triage Notes (Signed)
Pt states she feels SOB starting yesterday and has a history of asthma. Pt states she went to family reunion last Sunday and found out family member later tested positive for covid. Pt in NAD at this time, pt has dry cough, no SOB noted, no SOB noted on exertion.

## 2020-08-14 NOTE — ED Notes (Signed)
Called pt to bring back to room 54 , no response

## 2020-08-18 ENCOUNTER — Other Ambulatory Visit: Payer: Self-pay

## 2020-08-18 ENCOUNTER — Emergency Department: Payer: Medicaid Other

## 2020-08-18 ENCOUNTER — Encounter: Payer: Self-pay | Admitting: Emergency Medicine

## 2020-08-18 ENCOUNTER — Emergency Department
Admission: EM | Admit: 2020-08-18 | Discharge: 2020-08-18 | Disposition: A | Discharge status: Home / Self Care | Payer: Medicaid Other | Attending: Emergency Medicine | Admitting: Emergency Medicine

## 2020-08-18 DIAGNOSIS — F17210 Nicotine dependence, cigarettes, uncomplicated: Secondary | ICD-10-CM | POA: Insufficient documentation

## 2020-08-18 DIAGNOSIS — I10 Essential (primary) hypertension: Secondary | ICD-10-CM | POA: Diagnosis not present

## 2020-08-18 DIAGNOSIS — R059 Cough, unspecified: Secondary | ICD-10-CM | POA: Diagnosis present

## 2020-08-18 DIAGNOSIS — J40 Bronchitis, not specified as acute or chronic: Secondary | ICD-10-CM | POA: Insufficient documentation

## 2020-08-18 DIAGNOSIS — U071 COVID-19: Secondary | ICD-10-CM | POA: Diagnosis not present

## 2020-08-18 LAB — BASIC METABOLIC PANEL
Anion gap: 7 (ref 5–15)
BUN: 8 mg/dL (ref 6–20)
CO2: 27 mmol/L (ref 22–32)
Calcium: 8.8 mg/dL — ABNORMAL LOW (ref 8.9–10.3)
Chloride: 104 mmol/L (ref 98–111)
Creatinine, Ser: 0.65 mg/dL (ref 0.44–1.00)
GFR, Estimated: >60 mL/min (ref >60)
Glucose, Bld: 109 mg/dL — ABNORMAL HIGH (ref 70–99)
Potassium: 3.6 mmol/L (ref 3.5–5.1)
Sodium: 138 mmol/L (ref 135–145)

## 2020-08-18 LAB — CBC
HCT: 38.9 % (ref 36.0–46.0)
Hemoglobin: 13.2 g/dL (ref 12.0–15.0)
MCH: 29.5 pg (ref 26.0–34.0)
MCHC: 33.9 g/dL (ref 30.0–36.0)
MCV: 87 fL (ref 80.0–100.0)
Platelets: 256 10*3/uL (ref 150–400)
RBC: 4.47 MIL/uL (ref 3.87–5.11)
RDW: 13.9 % (ref 11.5–15.5)
WBC: 4.8 10*3/uL (ref 4.0–10.5)
nRBC: 0 % (ref 0.0–0.2)

## 2020-08-18 LAB — RESP PANEL BY RT-PCR (FLU A&B, COVID) ARPGX2
Influenza A by PCR: NEGATIVE
Influenza B by PCR: NEGATIVE
SARS Coronavirus 2 by RT PCR: POSITIVE — AB

## 2020-08-18 LAB — TROPONIN I (HIGH SENSITIVITY): Troponin I (High Sensitivity): 3 ng/L (ref <18)

## 2020-08-18 LAB — POC URINE PREG, ED: Preg Test, Ur: NEGATIVE

## 2020-08-18 MED ORDER — NIRMATRELVIR/RITONAVIR (PAXLOVID)TABLET: 3.0000 | ORAL_TABLET | Freq: Two times a day (BID) | ORAL | 0 refills | Status: AC

## 2020-08-18 MED ORDER — PREDNISONE 20 MG PO TABS: 40.0000 mg | ORAL_TABLET | Freq: Every day | ORAL | 0 refills | Status: DC

## 2020-08-18 NOTE — ED Provider Notes (Addendum)
[facility] Emergency Department Provider Note  ____________________________________________   Event Date/Time   First MD Initiated Contact with Patient 08/18/20 1707     (approximate)  I have reviewed the triage vital signs and the nursing notes.   HISTORY  Chief Complaint Chest Pain and Cough    HPI Andrea Hardy is a 40 y.o. female with bronchitis who comes in with cough and chest pain.  Patient reports that she has a history of asthma and she has some baseline shortness of breath from this.  She states that her chest pain is mostly with coughing.  She is been taking her inhaler with some improvement in symptoms, nothing makes it worse.  She also reports that her 39-year-old child has been sick too.  They have had a COVID exposure 2 weeks ago but report negative COVID at home test.  She states typically when she has a flareup she gets steroids and that makes her symptoms better.  Patient denies any risk factors for PE.          Past Medical History:  Diagnosis Date   Asthma    Bipolar 1 disorder (HCC)    Bronchitis 10/21/2017   Hypertension     Patient Active Problem List   Diagnosis Date Noted   Family history of chromosomal microdeletion 02/28/2015   Indication for care in labor and delivery, antepartum 01/07/2015   Vaginal bleeding before [redacted] weeks gestation 01/07/2015    History reviewed. No pertinent surgical history.  Prior to Admission medications   Medication Sig Start Date End Date Taking? Authorizing Provider  albuterol (PROVENTIL HFA;VENTOLIN HFA) 108 (90 Base) MCG/ACT inhaler Inhale 2 puffs into the lungs every 6 (six) hours as needed for wheezing or shortness of breath. 03/17/17   Evon Slack, PA-C  albuterol (PROVENTIL) (2.5 MG/3ML) 0.083% nebulizer solution Take 3 mLs (2.5 mg total) by nebulization every 4 (four) hours as needed for wheezing or shortness of breath. 01/06/20 01/05/21  Lucy Chris, PA  cyclobenzaprine  (FLEXERIL) 5 MG tablet Take 1 tablet (5 mg total) by mouth 3 (three) times daily as needed. 04/13/20   Menshew, Charlesetta Ivory, PA-C  ibuprofen (ADVIL) 800 MG tablet Take 1 tablet (800 mg total) by mouth every 8 (eight) hours as needed. 04/13/20   Menshew, Charlesetta Ivory, PA-C  lurasidone (LATUDA) 80 MG TABS tablet Take 80 mg by mouth at bedtime.    [provider]  promethazine-dextromethorphan (PROMETHAZINE-DM) 6.25-15 MG/5ML syrup Take 5 mLs by mouth at bedtime as needed for cough. 03/17/17   Evon Slack, PA-C  salmeterol (SEREVENT DISKUS) 50 MCG/DOSE diskus inhaler Inhale 1 puff into the lungs in the morning and at bedtime. 01/06/20   Lucy Chris, PA    Allergies Patient has no known allergies.  No family history on file.  Social History Social History   Tobacco Use   Smoking status: Every Day    Packs/day: 1.00    Types: Cigarettes   Smokeless tobacco: Never  Substance Use Topics   Alcohol use: No   Drug use: Yes    Types: Marijuana    Comment: last use two weeks ago      Review of Systems Constitutional: No fever/chills Eyes: No visual changes. ENT: No sore throat. Cardiovascular: Positive chest pain Respiratory: Denies shortness of breath.  Positive cough Gastrointestinal: No abdominal pain.  No nausea, no vomiting.  No diarrhea.  No constipation. Genitourinary: Negative for dysuria. Musculoskeletal: Negative for back pain.  Skin: Negative for rash. Neurological: Negative for headaches, focal weakness or numbness. All other ROS negative ____________________________________________   PHYSICAL EXAM:  VITAL SIGNS: ED Triage Vitals  Enc Vitals Group     BP 08/18/20 1615 (!) 141/83     Pulse Rate 08/18/20 1615 91     Resp 08/18/20 1615 20     Temp 08/18/20 1615 98.7 F (37.1 C)     Temp Source 08/18/20 1615 Oral     SpO2 08/18/20 1615 97 %     Weight 08/18/20 1616 220 lb (99.8 kg)     Height 08/18/20 1616 5\' 2"  (1.575 m)     Head Circumference  --      Peak Flow --      Pain Score 08/18/20 1616 5     Pain Loc --      Pain Edu? --      Excl. in GC? --     Constitutional: Alert and oriented. Well appearing and in no acute distress. Eyes: Conjunctivae are normal. EOMI. Head: Atraumatic. Nose: No congestion/rhinnorhea. Mouth/Throat: Mucous membranes are moist.   Neck: No stridor. Trachea Midline. FROM Cardiovascular: Normal rate, regular rhythm. Grossly normal heart sounds.  Good peripheral circulation. Respiratory: Normal respiratory effort.  No retractions. Lungs CTAB. Gastrointestinal: Soft and nontender. No distention. No abdominal bruits.  Musculoskeletal: No lower extremity tenderness nor edema.  No joint effusions. Neurologic:  Normal speech and language. No gross focal neurologic deficits are appreciated.  Skin:  Skin is warm, dry and intact. No rash noted. Psychiatric: Mood and affect are normal. Speech and behavior are normal. GU: Deferred   ____________________________________________   LABS (all labs ordered are listed, but only abnormal results are displayed)  Labs Reviewed  BASIC METABOLIC PANEL - Abnormal; Notable for the following components:      Result Value   Glucose, Bld 109 (*)    Calcium 8.8 (*)    All other components within normal limits  RESP PANEL BY RT-PCR (FLU A&B, COVID) ARPGX2  CBC  POC URINE PREG, ED  TROPONIN I (HIGH SENSITIVITY)   ____________________________________________   ED ECG REPORT I, 08/20/20, the attending physician, personally viewed and interpreted this ECG.  Sinus tachycardia rate of 101, no ST elevation, no T wave versions, normal intervals ____________________________________________  RADIOLOGY Concha Se, personally viewed and evaluated these images (plain radiographs) as part of my medical decision making, as well as reviewing the written report by the radiologist.  ED MD interpretation: No pneumonia  Official radiology report(s): DG Chest 2  View  Result Date: 08/18/2020 CLINICAL DATA:  Chest pain and shortness of breath EXAM: CHEST - 2 VIEW COMPARISON:  08/14/2020 FINDINGS: The heart size and mediastinal contours are within normal limits. Both lungs are clear. The visualized skeletal structures are unremarkable. IMPRESSION: No active cardiopulmonary disease. Electronically Signed   By: 10/15/2020 M.D.   On: 08/18/2020 17:06    ____________________________________________   PROCEDURES  Procedure(s) performed (including Critical Care):  Procedures   ____________________________________________   INITIAL IMPRESSION / ASSESSMENT AND PLAN / ED COURSE   Emmilynn Marut was evaluated in Emergency Department on 08/18/2020 for the symptoms described in the history of present illness. She was evaluated in the context of the global COVID-19 pandemic, which necessitated consideration that the patient might be at risk for infection with the SARS-CoV-2 virus that causes COVID-19. Institutional protocols and algorithms that pertain to the evaluation of patients at risk for COVID-19 are in a state  of rapid change based on information released by regulatory bodies including the CDC and federal and state organizations. These policies and algorithms were followed during the patient's care in the ED.    Most Likely DDx:  -  Patient was initially tachycardiac with a rate of 101 but after no interventions it came down to the 90s.  Given patient has similar symptoms as her son I suspect that this is more likely a viral illness.  COVID and flu are pending.  Her son has also been having cough and nose drainage.  At this time a very low suspicion for PE.  Will get EKG and cardiac markers to make sure no evidence of ACS   DDx that was also considered d/t potential to cause harm, but was found less likely based on history and physical (as detailed above): -PNA (no fevers, cough but CXR to evaluate) -PNX (reassured with equal b/l breath sounds, CXR to  evaluate) -Symptomatic anemia (will get H&H) -Pulmonary embolism as no sob at rest, not pleuritic in nature, no hypoxia -Aortic Dissection as no tearing pain and no radiation to the mid back, pulses equal -Pericarditis no rub on exam, EKG changes or hx to suggest dx -Tamponade (no notable SOB, tachycardic, hypotensive) -Esophageal rupture (no h/o diffuse vomitting/no crepitus)  Labs are reassuring.  No anemia.  Troponin is negative and symptoms have been going on for greater than 3 hours.  Patient is not hypoxic and very well-appearing.  I suspect that this is most likely a bronchitis brought on by a viral illness given her history of asthma, the cough.  Anticipate discharge home after negative COVID test  COVID test is positive.  Patient is well-appearing not hypoxic.  We discussed pros and cons of Paxlovid.  Patient meets requirements for used to due to elevated BMI and asthma.  Patient denies being on any medications.  She is no longer on Latuda.  Recommended her only continue the albuterol.  Discontinue the steroids given these are not as effective when on paxlovid.      ____________________________________________   FINAL CLINICAL IMPRESSION(S) / ED DIAGNOSES   Final diagnoses:  Bronchitis     MEDICATIONS GIVEN DURING THIS VISIT:  Medications - No data to display   ED Discharge Orders          Ordered    predniSONE (DELTASONE) 20 MG tablet  Daily with breakfast,   Status:  Discontinued        08/18/20 1740    nirmatrelvir/ritonavir EUA (PAXLOVID) TABS  2 times daily        08/18/20 1902             Note:  This document was prepared using Dragon voice recognition software and may include unintentional dictation errors.    Concha Se, MD 08/18/20 1740    Concha Se, MD 08/18/20 1904

## 2020-08-18 NOTE — Discharge Instructions (Addendum)
This is covid 19.   Take paxlovid. We are holding off on steroids given not effecting when on this new medication. We are holding off on steroids because they are not effective when on this new medication.  Use your inhaler as needed.  Return to the ER for worsening shortness of breath

## 2020-08-18 NOTE — ED Notes (Signed)
ED Provider at bedside. 

## 2020-08-18 NOTE — ED Triage Notes (Signed)
Pt states that she has had a cough, and having chest pain. She states that she has history of asthma, so she does get some SOB at times. She thinks that her chest pain is coming from the coughing and asthma.

## 2020-12-19 ENCOUNTER — Encounter: Payer: Self-pay | Admitting: Emergency Medicine

## 2020-12-19 ENCOUNTER — Emergency Department
Admission: EM | Admit: 2020-12-19 | Discharge: 2020-12-19 | Disposition: A | Discharge status: Home / Self Care | Payer: Medicaid Other | Attending: Emergency Medicine | Admitting: Emergency Medicine

## 2020-12-19 ENCOUNTER — Other Ambulatory Visit: Payer: Self-pay

## 2020-12-19 DIAGNOSIS — I10 Essential (primary) hypertension: Secondary | ICD-10-CM | POA: Insufficient documentation

## 2020-12-19 DIAGNOSIS — Z8616 Personal history of COVID-19: Secondary | ICD-10-CM | POA: Insufficient documentation

## 2020-12-19 DIAGNOSIS — F17210 Nicotine dependence, cigarettes, uncomplicated: Secondary | ICD-10-CM | POA: Diagnosis not present

## 2020-12-19 DIAGNOSIS — J45909 Unspecified asthma, uncomplicated: Secondary | ICD-10-CM | POA: Insufficient documentation

## 2020-12-19 DIAGNOSIS — Z7951 Long term (current) use of inhaled steroids: Secondary | ICD-10-CM | POA: Insufficient documentation

## 2020-12-19 DIAGNOSIS — J069 Acute upper respiratory infection, unspecified: Secondary | ICD-10-CM | POA: Insufficient documentation

## 2020-12-19 DIAGNOSIS — Z79899 Other long term (current) drug therapy: Secondary | ICD-10-CM | POA: Diagnosis not present

## 2020-12-19 DIAGNOSIS — R0981 Nasal congestion: Secondary | ICD-10-CM | POA: Diagnosis present

## 2020-12-19 DIAGNOSIS — Z20822 Contact with and (suspected) exposure to covid-19: Secondary | ICD-10-CM | POA: Insufficient documentation

## 2020-12-19 LAB — RESP PANEL BY RT-PCR (FLU A&B, COVID) ARPGX2
Influenza A by PCR: NEGATIVE
Influenza B by PCR: NEGATIVE
SARS Coronavirus 2 by RT PCR: NEGATIVE

## 2020-12-19 MED ORDER — ALBUTEROL SULFATE HFA 108 (90 BASE) MCG/ACT IN AERS: 2.0000 | INHALATION_SPRAY | Freq: Four times a day (QID) | RESPIRATORY_TRACT | 2 refills | Status: AC | PRN

## 2020-12-19 MED ORDER — BENZONATATE 100 MG PO CAPS: 100.0000 mg | ORAL_CAPSULE | Freq: Three times a day (TID) | ORAL | 0 refills | Status: AC | PRN

## 2020-12-19 MED ORDER — BENZONATATE 100 MG PO CAPS: 100.0000 mg | ORAL_CAPSULE | Freq: Three times a day (TID) | ORAL | 0 refills | Status: DC | PRN

## 2020-12-19 NOTE — Discharge Instructions (Signed)
You can take Tessalon Perles up to three times daily.  You can take 2 puffs of Albuterol up to every eight hours as needed for shortness of breath.

## 2020-12-19 NOTE — ED Triage Notes (Signed)
C/O cough, congestions, body aches x 2 days.

## 2020-12-19 NOTE — ED Provider Notes (Signed)
[facility]-EMERGENCY DEPARTMENT  ____________________________________________  Time seen: Approximately 4:56 PM  I have reviewed the triage vital signs and the nursing notes.   HISTORY  Chief Complaint URI   Historian Patient     HPI Andrea Hardy is a 40 y.o. female presents to the emergency department with nasal congestion, body aches and cough for the past 2 days.  Patient has also had some low-grade fever.  She denies chest pain, chest tightness or abdominal pain.  No known sick contacts in the home with similar symptoms.   Past Medical History:  Diagnosis Date   Asthma    Bipolar 1 disorder ([place])    Bronchitis 10/21/2017   Hypertension      Immunizations up to date:  Yes.     Past Medical History:  Diagnosis Date   Asthma    Bipolar 1 disorder ([place])    Bronchitis 10/21/2017   Hypertension     Patient Active Problem List   Diagnosis Date Noted   Family history of chromosomal microdeletion 02/28/2015   Indication for care in labor and delivery, antepartum 01/07/2015   Vaginal bleeding before [redacted] weeks gestation 01/07/2015    History reviewed. No pertinent surgical history.  Prior to Admission medications   Medication Sig Start Date End Date Taking? Authorizing Provider  albuterol (VENTOLIN HFA) 108 (90 Base) MCG/ACT inhaler Inhale 2 puffs into the lungs every 6 (six) hours as needed for wheezing or shortness of breath. 12/19/20  Yes Vallarie Mare M, PA-C  benzonatate (TESSALON PERLES) 100 MG capsule Take 1 capsule (100 mg total) by mouth 3 (three) times daily as needed for up to 7 days for cough. 12/19/20 12/26/20  Lannie Fields, PA-C  cyclobenzaprine (FLEXERIL) 5 MG tablet Take 1 tablet (5 mg total) by mouth 3 (three) times daily as needed. 04/13/20   Menshew, Dannielle Karvonen, PA-C  ibuprofen (ADVIL) 800 MG tablet Take 1 tablet (800 mg total) by mouth every 8 (eight) hours as needed. 04/13/20   Menshew, Dannielle Karvonen, PA-C  lurasidone (LATUDA) 80 MG TABS tablet  Take 80 mg by mouth at bedtime.    [provider]  promethazine-dextromethorphan (PROMETHAZINE-DM) 6.25-15 MG/5ML syrup Take 5 mLs by mouth at bedtime as needed for cough. 03/17/17   Duanne Guess, PA-C  salmeterol (SEREVENT DISKUS) 50 MCG/DOSE diskus inhaler Inhale 1 puff into the lungs in the morning and at bedtime. 01/06/20   Marlana Salvage, PA    Allergies Patient has no known allergies.  No family history on file.  Social History Social History   Tobacco Use   Smoking status: Every Day    Packs/day: 1.00    Types: Cigarettes   Smokeless tobacco: Never  Substance Use Topics   Alcohol use: No   Drug use: Yes    Types: Marijuana    Comment: last use two weeks ago      Review of Systems  Constitutional: Patient has fever.  Eyes: No visual changes. No discharge ENT: Patient has congestion.  Cardiovascular: no chest pain. Respiratory: Patient has cough.  Gastrointestinal: No abdominal pain.  No nausea, no vomiting. Patient had diarrhea.  Genitourinary: Negative for dysuria. No hematuria Musculoskeletal: Patient has myalgias.  Skin: Negative for rash, abrasions, lacerations, ecchymosis. Neurological: Patient has headache, no focal weakness or numbness.    ____________________________________________   PHYSICAL EXAM:  VITAL SIGNS: ED Triage Vitals  Enc Vitals Group     BP 12/19/20 1416 (!) 175/104     Pulse Rate 12/19/20  1416 81     Resp 12/19/20 1416 16     Temp 12/19/20 1416 97.8 F (36.6 C)     Temp Source 12/19/20 1416 Oral     SpO2 12/19/20 1416 97 %     Weight 12/19/20 1359 220 lb 0.3 oz (99.8 kg)     Height 12/19/20 1359 5\' 3"  (1.6 m)     Head Circumference --      Peak Flow --      Pain Score 12/19/20 1359 5     Pain Loc --      Pain Edu? --      Excl. in [place]? --      Constitutional: Alert and oriented. Patient is lying supine. Eyes: Conjunctivae are normal. PERRL. EOMI. Head: Atraumatic. ENT:      Ears: Tympanic membranes are  mildly injected with mild effusion bilaterally.       Nose: No congestion/rhinnorhea.      Mouth/Throat: Mucous membranes are moist. Posterior pharynx is mildly erythematous.  Hematological/Lymphatic/Immunilogical: No cervical lymphadenopathy.  Cardiovascular: Normal rate, regular rhythm. Normal S1 and S2.  Good peripheral circulation. Respiratory: Normal respiratory effort without tachypnea or retractions. Lungs CTAB. Good air entry to the bases with no decreased or absent breath sounds. Gastrointestinal: Bowel sounds 4 quadrants. Soft and nontender to palpation. No guarding or rigidity. No palpable masses. No distention. No CVA tenderness. Musculoskeletal: Full range of motion to all extremities. No gross deformities appreciated. Neurologic:  Normal speech and language. No gross focal neurologic deficits are appreciated.  Skin:  Skin is warm, dry and intact. No rash noted. Psychiatric: Mood and affect are normal. Speech and behavior are normal. Patient exhibits appropriate insight and judgement.   ____________________________________________   LABS (all labs ordered are listed, but only abnormal results are displayed)  Labs Reviewed  RESP PANEL BY RT-PCR (FLU A&B, COVID) ARPGX2   ____________________________________________  EKG   ____________________________________________  RADIOLOGY   No results found.  ____________________________________________    PROCEDURES  Procedure(s) performed:     Procedures     Medications - No data to display   ____________________________________________   INITIAL IMPRESSION / ASSESSMENT AND PLAN / ED COURSE  Pertinent labs & imaging results that were available during my care of the patient were reviewed by me and considered in my medical decision making (see chart for details).      Assessment and plan Viral URI 40 year old female presents to the emergency department with viral URI-like symptoms.  She tested negative  for COVID and flu.  Rest and hydration were encouraged at home.  Patient was prescribed Tessalon Perles and albuterol inhaler.      ____________________________________________  FINAL CLINICAL IMPRESSION(S) / ED DIAGNOSES  Final diagnoses:  Viral upper respiratory tract infection      NEW MEDICATIONS STARTED DURING THIS VISIT:  ED Discharge Orders          Ordered    albuterol (VENTOLIN HFA) 108 (90 Base) MCG/ACT inhaler  Every 6 hours PRN        12/19/20 1622    benzonatate (TESSALON PERLES) 100 MG capsule  3 times daily PRN,   Status:  Discontinued        12/19/20 1622    benzonatate (TESSALON PERLES) 100 MG capsule  3 times daily PRN        12/19/20 1622                This chart was dictated using voice recognition software/Dragon. Despite best efforts  to proofread, errors can occur which can change the meaning. Any change was purely unintentional.     Orvil Feil, PA-C 12/19/20 1658    Shaune Pollack, MD 12/19/20 2027

## 2021-08-31 ENCOUNTER — Encounter: Payer: Self-pay | Admitting: Emergency Medicine

## 2021-08-31 ENCOUNTER — Other Ambulatory Visit: Payer: Self-pay

## 2021-08-31 ENCOUNTER — Emergency Department
Admission: EM | Admit: 2021-08-31 | Discharge: 2021-08-31 | Disposition: A | Discharge status: Home / Self Care | Payer: Medicaid Other | Attending: Emergency Medicine | Admitting: Emergency Medicine

## 2021-08-31 ENCOUNTER — Emergency Department: Payer: Medicaid Other

## 2021-08-31 DIAGNOSIS — M79605 Pain in left leg: Secondary | ICD-10-CM | POA: Insufficient documentation

## 2021-08-31 DIAGNOSIS — F17210 Nicotine dependence, cigarettes, uncomplicated: Secondary | ICD-10-CM | POA: Diagnosis not present

## 2021-08-31 DIAGNOSIS — M79604 Pain in right leg: Secondary | ICD-10-CM | POA: Insufficient documentation

## 2021-08-31 DIAGNOSIS — I10 Essential (primary) hypertension: Secondary | ICD-10-CM | POA: Insufficient documentation

## 2021-08-31 DIAGNOSIS — M791 Myalgia, unspecified site: Secondary | ICD-10-CM | POA: Insufficient documentation

## 2021-08-31 DIAGNOSIS — S39012A Strain of muscle, fascia and tendon of lower back, initial encounter: Secondary | ICD-10-CM | POA: Diagnosis not present

## 2021-08-31 DIAGNOSIS — J45909 Unspecified asthma, uncomplicated: Secondary | ICD-10-CM | POA: Insufficient documentation

## 2021-08-31 DIAGNOSIS — S3992XA Unspecified injury of lower back, initial encounter: Secondary | ICD-10-CM | POA: Diagnosis present

## 2021-08-31 DIAGNOSIS — Y92410 Unspecified street and highway as the place of occurrence of the external cause: Secondary | ICD-10-CM | POA: Insufficient documentation

## 2021-08-31 DIAGNOSIS — M7918 Myalgia, other site: Secondary | ICD-10-CM

## 2021-08-31 LAB — POC URINE PREG, ED: Preg Test, Ur: NEGATIVE

## 2021-08-31 NOTE — Discharge Instructions (Addendum)
Call make an appointment with your primary care provider if any continued problems or concerns.  Ibuprofen over-the-counter with food 3 tablets twice a day.  You may apply ice or heat to your back as needed for discomfort.
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
## 2021-08-31 NOTE — ED Notes (Signed)
Pt ambulatory to bathroom to provided urine specimen

## 2022-12-23 IMAGING — CR DG CHEST 2V
2 series · 2 of 2 positions shown · non-contrast
Comparison: 08/14/2020

CLINICAL DATA: Chest pain and shortness of breath

EXAM:
CHEST - 2 VIEW

[chest pa]
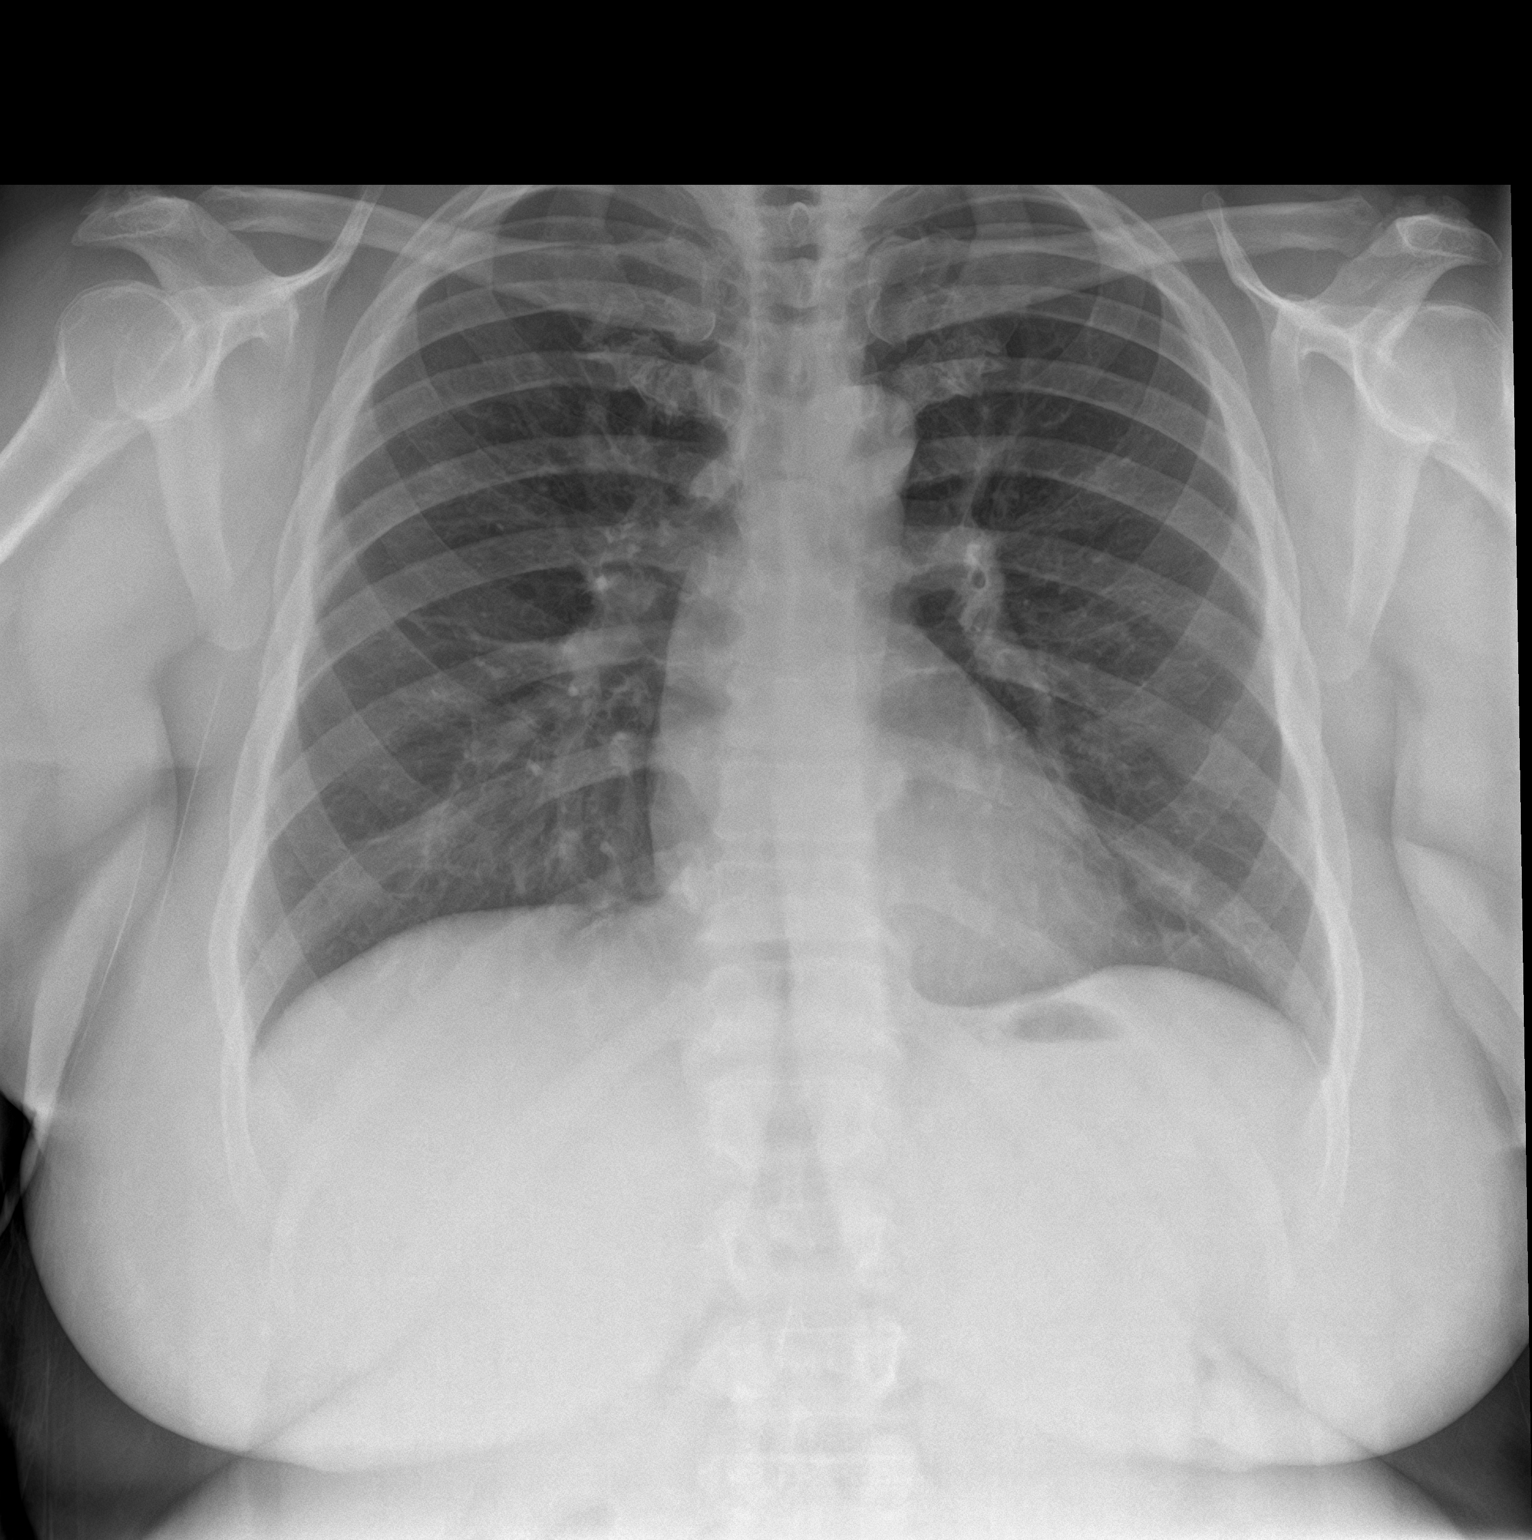

[chest lat]
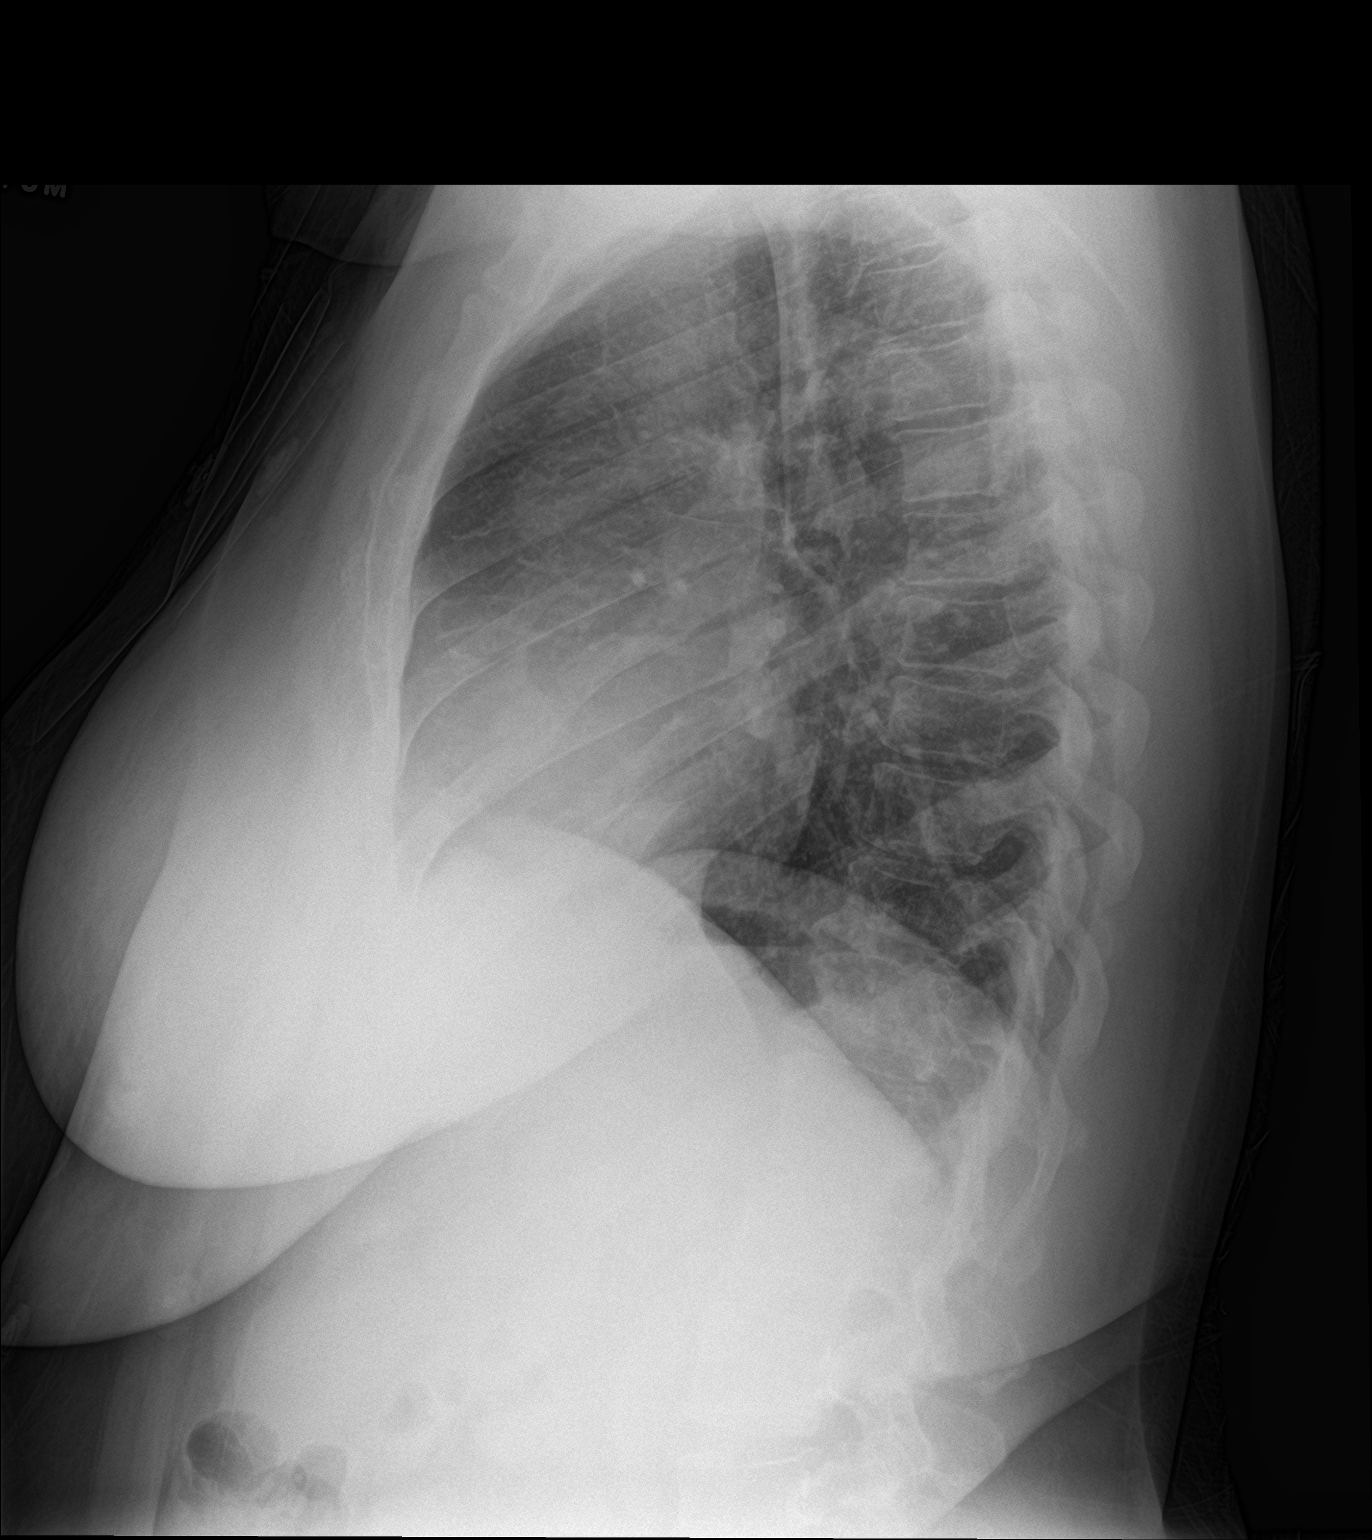

[2 of 2 positions shown; findings below may reference images not displayed]

FINDINGS: The heart size and mediastinal contours are within normal limits.
Both lungs are clear. The visualized skeletal structures are
unremarkable.
IMPRESSION: No active cardiopulmonary disease.
